# Patient Record
Sex: Female | Born: 1965 | Race: White | Hispanic: No | Marital: Married | Smoking: Never smoker
Health system: Southern US, Community
[De-identification: ages and names within clinical notes are randomized; demographics above are authoritative.]

## PROBLEM LIST (undated history)

## (undated) DIAGNOSIS — K219 Gastro-esophageal reflux disease without esophagitis: Secondary | ICD-10-CM

## (undated) DIAGNOSIS — Z889 Allergy status to unspecified drugs, medicaments and biological substances status: Secondary | ICD-10-CM

## (undated) DIAGNOSIS — E119 Type 2 diabetes mellitus without complications: Secondary | ICD-10-CM

## (undated) HISTORY — DX: Allergy status to unspecified drugs, medicaments and biological substances: Z88.9

## (undated) HISTORY — PX: WISDOM TOOTH EXTRACTION: SHX21

## (undated) HISTORY — PX: COLONOSCOPY: SHX174

## (undated) HISTORY — DX: Type 2 diabetes mellitus without complications: E11.9

---

## 2012-07-27 ENCOUNTER — Ambulatory Visit: Payer: Self-pay | Admitting: Internal Medicine

## 2016-09-06 ENCOUNTER — Other Ambulatory Visit: Payer: Self-pay | Admitting: Obstetrics and Gynecology

## 2016-09-06 ENCOUNTER — Ambulatory Visit (INDEPENDENT_AMBULATORY_CARE_PROVIDER_SITE_OTHER): Payer: BLUE CROSS/BLUE SHIELD | Admitting: Obstetrics and Gynecology

## 2016-09-06 ENCOUNTER — Encounter: Payer: Self-pay | Admitting: Obstetrics and Gynecology

## 2016-09-06 VITALS — BP 134/78 | HR 83 | Ht 64.0 in | Wt 201.7 lb

## 2016-09-06 DIAGNOSIS — Z01419 Encounter for gynecological examination (general) (routine) without abnormal findings: Secondary | ICD-10-CM

## 2016-09-06 DIAGNOSIS — F39 Unspecified mood [affective] disorder: Secondary | ICD-10-CM

## 2016-09-06 DIAGNOSIS — Z23 Encounter for immunization: Secondary | ICD-10-CM

## 2016-09-06 DIAGNOSIS — E669 Obesity, unspecified: Secondary | ICD-10-CM | POA: Diagnosis not present

## 2016-09-06 DIAGNOSIS — R4586 Emotional lability: Secondary | ICD-10-CM

## 2016-09-06 MED ORDER — LEVONORGEST-ETH ESTRAD 91-DAY 0.1-0.02 & 0.01 MG PO TABS: 1.0000 | ORAL_TABLET | Freq: Every day | ORAL | 4 refills | Status: DC

## 2016-09-06 NOTE — Patient Instructions (Signed)
  Place annual gynecologic exam patient instructions here.  Thank you for enrolling in MyChart. Please follow the instructions below to securely access your online medical record. MyChart allows you to send messages to your doctor, view your test results, manage appointments, and more.   How Do I Sign Up? 1. In your Internet browser, go to [employer] and enter [email or web address]. 2. Click on the Sign Up Now link in the Sign In box. You will see the New Member Sign Up page. 3. Enter your MyChart Access Code exactly as it appears below. You will not need to use this code after you've completed the sign-up process. If you do not sign up before the expiration date, you must request a new code.  MyChart Access Code: [email or web address] Expires: 11/05/2016  9:06 AM  4. Enter your Social Security Number ([id number]) and Date of Birth (mm/dd/yyyy) as indicated and click Submit. You will be taken to the next sign-up page. 5. Create a MyChart ID. This will be your MyChart login ID and cannot be changed, so think of one that is secure and easy to remember. 6. Create a MyChart password. You can change your password at any time. 7. Enter your Password Reset Question and Answer. This can be used at a later time if you forget your password.  8. Enter your e-mail address. You will receive e-mail notification when new information is available in MyChart. 9. Click Sign Up. You can now view your medical record.   Additional Information Remember, MyChart is NOT to be used for urgent needs. For medical emergencies, dial 911.

## 2016-09-06 NOTE — Progress Notes (Signed)
Subjective:   Kelsey Baldwin is a 51 y.o. G3P3 Caucasian female here for a routine well-woman exam.  Patient's last menstrual period was 08/27/2016.    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  Past Medical History Past Medical History:  Diagnosis Date  . H/O seasonal allergies     Past Surgical History No past surgical history on file.  Gynecologic History G3P3  Patient's last menstrual period was 08/27/2016. Contraception: coitus interruptus and rhythm method Last Pap: ?Marland Kitchen. Results were: normal Last mammogram: at age 51. Results were: normal   Obstetric History OB History  Gravida Para Term Preterm AB Living  3 3          SAB TAB Ectopic Multiple Live Births               # Outcome Date GA Lbr Len/2nd Weight Sex Delivery Anes PTL Lv  3 Para 2000    F Vag-Spont     2 Para 1994    F Vag-Spont     1 Para 1988    F Vag-Spont         Current Medications No current outpatient prescriptions on file prior to visit.   No current facility-administered medications on file prior to visit.     Review of Systems Patient denies any headaches, blurred vision, shortness of breath, chest pain, abdominal pain, problems with bowel movements, urination, or intercourse.  Objective:  BP 134/78   Pulse 83   Ht 5\' 4"  (1.626 m)   Wt 201 lb 11.2 oz (91.5 kg)   LMP 08/27/2016   BMI 34.62 kg/m  Physical Exam  General:  Well developed, well nourished, no acute distress. She is alert and  oriented x3. Skin:  Warm and dry Neck:  Midline trachea, no thyromegaly or nodules Cardiovascular: Regular rate and rhythm, no murmur heard Lungs:  Effort normal, all lung fields clear to auscultation bilaterally Breasts:  No dominant palpable mass, retraction, or nipple discharge Abdomen:  Soft, non tender, no hepatosplenomegaly or masses Pelvic:  External genitalia is normal in appearance.  The vagina is normal in appearance. The cervix is bulbous, no CMT.  Thin prep pap is done with HR HPV cotesting. Uterus is felt to be normal size, shape, and contour.  No adnexal masses or tenderness noted. Extremities:  No swelling or varicosities noted Psych:  She has a normal mood and affect  Assessment:   Healthy well-woman exam BC counseling Needs Tdap Obesity Mood swings   Plan:  Declined flu vaccine Tdap given New start LoSeasonique F/U 1 year for AE, or sooner if needed Mammogram ordered or sooner if problems Colonoscopy ordered   Kalob Bergen Suzan NailerN Chalmers Iddings, CNM

## 2016-09-07 LAB — COMPREHENSIVE METABOLIC PANEL
ALK PHOS: 87 IU/L (ref 39–117)
ALT: 14 IU/L (ref 0–32)
AST: 16 IU/L (ref 0–40)
Albumin/Globulin Ratio: 1.3 (ref 1.2–2.2)
Albumin: 3.8 g/dL (ref 3.5–5.5)
BILIRUBIN TOTAL: 0.3 mg/dL (ref 0.0–1.2)
BUN/Creatinine Ratio: 18 (ref 9–23)
BUN: 12 mg/dL (ref 6–24)
CHLORIDE: 101 mmol/L (ref 96–106)
CO2: 24 mmol/L (ref 18–29)
CREATININE: 0.68 mg/dL (ref 0.57–1.00)
Calcium: 8.7 mg/dL (ref 8.7–10.2)
GFR calc Af Amer: 118 mL/min/{1.73_m2} (ref >59)
GFR calc non Af Amer: 102 mL/min/{1.73_m2} (ref >59)
GLUCOSE: 101 mg/dL — AB (ref 65–99)
Globulin, Total: 3 g/dL (ref 1.5–4.5)
Potassium: 4 mmol/L (ref 3.5–5.2)
Sodium: 139 mmol/L (ref 134–144)
Total Protein: 6.8 g/dL (ref 6.0–8.5)

## 2016-09-07 LAB — CBC
HEMATOCRIT: 38.1 % (ref 34.0–46.6)
HEMOGLOBIN: 12.9 g/dL (ref 11.1–15.9)
MCH: 28 pg (ref 26.6–33.0)
MCHC: 33.9 g/dL (ref 31.5–35.7)
MCV: 83 fL (ref 79–97)
Platelets: 292 10*3/uL (ref 150–379)
RBC: 4.6 x10E6/uL (ref 3.77–5.28)
RDW: 14.2 % (ref 12.3–15.4)
WBC: 7.3 10*3/uL (ref 3.4–10.8)

## 2016-09-07 LAB — HEMOGLOBIN A1C
ESTIMATED AVERAGE GLUCOSE: 123 mg/dL
Hgb A1c MFr Bld: 5.9 % — ABNORMAL HIGH (ref 4.8–5.6)

## 2016-09-07 LAB — LIPID PANEL
CHOL/HDL RATIO: 4.1 ratio (ref 0.0–4.4)
Cholesterol, Total: 157 mg/dL (ref 100–199)
HDL: 38 mg/dL — ABNORMAL LOW (ref >39)
LDL Calculated: 68 mg/dL (ref 0–99)
Triglycerides: 254 mg/dL — ABNORMAL HIGH (ref 0–149)
VLDL CHOLESTEROL CAL: 51 mg/dL — AB (ref 5–40)

## 2016-09-07 LAB — VITAMIN D 25 HYDROXY (VIT D DEFICIENCY, FRACTURES): VIT D 25 HYDROXY: 16.5 ng/mL — AB (ref 30.0–100.0)

## 2016-09-07 LAB — TSH: TSH: 1.09 u[IU]/mL (ref 0.450–4.500)

## 2016-09-09 LAB — CYTOLOGY - PAP

## 2016-09-11 ENCOUNTER — Other Ambulatory Visit: Payer: Self-pay

## 2016-09-11 ENCOUNTER — Telehealth: Payer: Self-pay

## 2016-09-11 NOTE — Telephone Encounter (Signed)
Gastroenterology Pre-Procedure Review  Request Date:  Requesting Physician: Dr.   PATIENT REVIEW QUESTIONS: The patient responded to the following health history questions as indicated:    1. Are you having any GI issues? no 2. Do you have a personal history of Polyps? no 3. Do you have a family history of Colon Cancer or Polyps? no 4. Diabetes Mellitus? no 5. Joint replacements in the past 12 months?no 6. Major health problems in the past 3 months?no 7. Any artificial heart valves, MVP, or defibrillator?no    MEDICATIONS & ALLERGIES:    Patient reports the following regarding taking any anticoagulation/antiplatelet therapy:   Plavix, Coumadin, Eliquis, Xarelto, Lovenox, Pradaxa, Brilinta, or Effient? no Aspirin? no  Patient confirms/reports the following medications:  Current Outpatient Prescriptions  Medication Sig Dispense Refill  . fluticasone (FLONASE) 50 MCG/ACT nasal spray Place into both nostrils daily.    . Levonorgestrel-Ethinyl Estradiol (AMETHIA,CAMRESE) 0.1-0.02 & 0.01 MG tablet Take 1 tablet by mouth daily. 1 Package 4   No current facility-administered medications for this visit.     Patient confirms/reports the following allergies:  No Known Allergies  No orders of the defined types were placed in this encounter.   AUTHORIZATION INFORMATION Primary Insurance: 1D#: Group #:  Secondary Insurance: 1D#: Group #:  SCHEDULE INFORMATION: Date: 10/10/16 Time: Location: [facility]

## 2016-09-18 ENCOUNTER — Telehealth: Payer: Self-pay | Admitting: *Deleted

## 2016-09-18 ENCOUNTER — Other Ambulatory Visit: Payer: Self-pay | Admitting: Obstetrics and Gynecology

## 2016-09-18 DIAGNOSIS — R7303 Prediabetes: Secondary | ICD-10-CM

## 2016-09-18 DIAGNOSIS — E559 Vitamin D deficiency, unspecified: Secondary | ICD-10-CM | POA: Insufficient documentation

## 2016-09-18 MED ORDER — VITAMIN D (ERGOCALCIFEROL) 1.25 MG (50000 UNIT) PO CAPS: 50000.0000 [IU] | ORAL_CAPSULE | ORAL | 1 refills | Status: DC

## 2016-09-18 NOTE — Telephone Encounter (Signed)
Mailed info to pt 

## 2016-09-18 NOTE — Telephone Encounter (Signed)
-----   Message from Purcell NailsMelody N Shambley, [place] sent at 09/18/2016  3:02 PM EST ----- Please mail info on vit D and Pre-diabetes

## 2016-09-30 ENCOUNTER — Encounter: Payer: Self-pay | Admitting: Obstetrics and Gynecology

## 2016-12-04 ENCOUNTER — Other Ambulatory Visit: Payer: Self-pay

## 2016-12-16 ENCOUNTER — Encounter: Payer: Self-pay | Admitting: Obstetrics and Gynecology

## 2016-12-17 NOTE — Discharge Instructions (Signed)

## 2016-12-19 ENCOUNTER — Ambulatory Visit: Payer: BLUE CROSS/BLUE SHIELD | Admitting: Anesthesiology

## 2016-12-19 ENCOUNTER — Ambulatory Visit
Admission: RE | Admit: 2016-12-19 | Discharge: 2016-12-19 | Disposition: A | Discharge status: Home / Self Care | Payer: BLUE CROSS/BLUE SHIELD | Source: Ambulatory Visit | Attending: Gastroenterology | Admitting: Gastroenterology

## 2016-12-19 ENCOUNTER — Encounter
Admission: RE | Disposition: A | Discharge status: Home / Self Care | Payer: Self-pay | Source: Ambulatory Visit | Attending: Gastroenterology

## 2016-12-19 DIAGNOSIS — Z79899 Other long term (current) drug therapy: Secondary | ICD-10-CM | POA: Insufficient documentation

## 2016-12-19 DIAGNOSIS — Z1211 Encounter for screening for malignant neoplasm of colon: Secondary | ICD-10-CM | POA: Diagnosis not present

## 2016-12-19 DIAGNOSIS — K219 Gastro-esophageal reflux disease without esophagitis: Secondary | ICD-10-CM | POA: Insufficient documentation

## 2016-12-19 HISTORY — DX: Gastro-esophageal reflux disease without esophagitis: K21.9

## 2016-12-19 HISTORY — PX: COLONOSCOPY WITH PROPOFOL: SHX5780

## 2016-12-19 SURGERY — COLONOSCOPY WITH PROPOFOL
Anesthesia: Monitor Anesthesia Care | Wound class: Contaminated

## 2016-12-19 MED ORDER — LACTATED RINGERS IV SOLN
INTRAVENOUS | Status: DC
  Administered 2016-12-19 (×2): via INTRAVENOUS

## 2016-12-19 MED ORDER — OXYCODONE HCL 5 MG/5ML PO SOLN: 5.0000 mg | Freq: Once | ORAL | Status: DC | PRN

## 2016-12-19 MED ORDER — OXYCODONE HCL 5 MG PO TABS: 5.0000 mg | ORAL_TABLET | Freq: Once | ORAL | Status: DC | PRN

## 2016-12-19 MED ORDER — LIDOCAINE HCL (CARDIAC) 20 MG/ML IV SOLN
INTRAVENOUS | Status: DC | PRN
  Administered 2016-12-19: 30 mg via INTRAVENOUS

## 2016-12-19 MED ORDER — PROPOFOL 10 MG/ML IV BOLUS
INTRAVENOUS | Status: DC | PRN
  Administered 2016-12-19: 20 mg via INTRAVENOUS
  Administered 2016-12-19 (×2): 60 mg via INTRAVENOUS
  Administered 2016-12-19 (×3): 20 mg via INTRAVENOUS
  Administered 2016-12-19: 60 mg via INTRAVENOUS

## 2016-12-19 MED ORDER — STERILE WATER FOR IRRIGATION IR SOLN
Status: DC | PRN
  Administered 2016-12-19: 08:00:00

## 2016-12-19 SURGICAL SUPPLY — 23 items

## 2016-12-19 NOTE — Transfer of Care (Signed)
Immediate Anesthesia Transfer of Care Note  Patient: Kelsey Baldwin  Procedure(s) Performed: Procedure(s): COLONOSCOPY WITH PROPOFOL (N/A)  Patient Location: PACU  Anesthesia Type: MAC  Level of Consciousness: awake, alert  and patient cooperative  Airway and Oxygen Therapy: Patient Spontanous Breathing and Patient connected to supplemental oxygen  Post-op Assessment: Post-op Vital signs reviewed, Patient's Cardiovascular Status Stable, Respiratory Function Stable, Patent Airway and No signs of Nausea or vomiting  Post-op Vital Signs: Reviewed and stable  Complications: No apparent anesthesia complications

## 2016-12-19 NOTE — Anesthesia Postprocedure Evaluation (Signed)
Anesthesia Post Note  Patient: Kelsey Baldwin  Procedure(s) Performed: Procedure(s) (LRB): COLONOSCOPY WITH PROPOFOL (N/A)  Patient location during evaluation: PACU Anesthesia Type: MAC Level of consciousness: awake and alert Pain management: pain level controlled Vital Signs Assessment: post-procedure vital signs reviewed and stable Respiratory status: spontaneous breathing Cardiovascular status: blood pressure returned to baseline Postop Assessment: no headache Anesthetic complications: no    Jaci Standard, III,  Ashley Montminy D

## 2016-12-19 NOTE — H&P (Signed)
   Kelsey Miniumarren Phillis Thackeray, MD [facility] [address] [place], [place] [address] Phone: [phone] Fax : [phone]  Primary Care Physician:  Patient, No Pcp Per Primary Gastroenterologist:  Dr. Servando SnareWohl  Pre-Procedure History & Physical: HPI:  Kelsey Baldwin is a 51 y.o. female is here for a screening colonoscopy.   Past Medical History:  Diagnosis Date  . GERD (gastroesophageal reflux disease)   . H/O seasonal allergies     Past Surgical History:  Procedure Laterality Date  . COLONOSCOPY    . WISDOM TOOTH EXTRACTION      Prior to Admission medications   Medication Sig Start Date End Date Taking? Authorizing Provider  Ascorbic Acid (VITAMIN C) 1000 MG tablet Take 1,000 mg by mouth daily.   Yes [provider]  fluticasone (FLONASE) 50 MCG/ACT nasal spray Place into both nostrils daily.   Yes [provider]  Levonorgestrel-Ethinyl Estradiol (AMETHIA,CAMRESE) 0.1-0.02 & 0.01 MG tablet Take 1 tablet by mouth daily. 09/06/16  Yes Shambley, Melody N, CNM  Multiple Vitamin (MULTIVITAMIN) tablet Take 1 tablet by mouth daily.   Yes [provider]  omeprazole (PRILOSEC) 20 MG capsule Take 20 mg by mouth daily.   Yes [provider]  Vitamin D, Ergocalciferol, (DRISDOL) 50000 units CAPS capsule Take 1 capsule (50,000 Units total) by mouth 2 (two) times a week. 09/19/16  Yes Shambley, Melody N, CNM    Allergies as of 09/11/2016  . (No Known Allergies)    Family History  Problem Relation Age of Onset  . Diabetes Mother   . Dementia Mother   . Cancer Father        lung     Social History   Social History  . Marital status: Married    Spouse name: N/A  . Number of children: N/A  . Years of education: N/A   Occupational History  . Not on file.   Social History Main Topics  . Smoking status: Never Smoker  . Smokeless tobacco: Never Used  . Alcohol use Yes  . Drug use: No  . Sexual activity: Yes    Birth control/ protection: None   Other  Topics Concern  . Not on file   Social History Narrative  . No narrative on file    Review of Systems: See HPI, otherwise negative ROS  Physical Exam: BP 121/80   Baldwin 70   Temp 97.7 F (36.5 C) (Temporal)   Ht 5\' 4"  (1.626 m)   Wt 179 lb (81.2 kg)   SpO2 99%   BMI 30.73 kg/m  General:   Alert,  pleasant and cooperative in NAD Head:  Normocephalic and atraumatic. Neck:  Supple; no masses or thyromegaly. Lungs:  Clear throughout to auscultation.    Heart:  Regular rate and rhythm. Abdomen:  Soft, nontender and nondistended. Normal bowel sounds, without guarding, and without rebound.   Neurologic:  Alert and  oriented x4;  grossly normal neurologically.  Impression/Plan: Kelsey Baldwin is now here to undergo a screening colonoscopy.  Risks, benefits, and alternatives regarding colonoscopy have been reviewed with the patient.  Questions have been answered.  All parties agreeable.

## 2016-12-19 NOTE — Anesthesia Preprocedure Evaluation (Signed)
Anesthesia Evaluation  Patient identified by MRN, date of birth, ID band Patient awake    Reviewed: Allergy & Precautions, NPO status , Patient's Chart, lab work & pertinent test results  Airway Mallampati: II  TM Distance: >3 FB Neck ROM: full    Dental no notable dental hx.    Pulmonary neg pulmonary ROS,    Pulmonary exam normal        Cardiovascular negative cardio ROS Normal cardiovascular exam     Neuro/Psych    GI/Hepatic Neg liver ROS, Medicated,  Endo/Other  negative endocrine ROS  Renal/GU negative Renal ROS     Musculoskeletal   Abdominal   Peds  Hematology negative hematology ROS (+)   Anesthesia Other Findings   Reproductive/Obstetrics                            Anesthesia Physical Anesthesia Plan  ASA: II  Anesthesia Plan: MAC   Post-op Pain Management:    Induction:   Airway Management Planned:   Additional Equipment:   Intra-op Plan:   Post-operative Plan:   Informed Consent: I have reviewed the patients History and Physical, chart, labs and discussed the procedure including the risks, benefits and alternatives for the proposed anesthesia with the patient or authorized representative who has indicated his/her understanding and acceptance.     Plan Discussed with:   Anesthesia Plan Comments:         Anesthesia Quick Evaluation

## 2016-12-19 NOTE — Anesthesia Procedure Notes (Signed)
Procedure Name: MAC Performed by: Oyuki Hogan Pre-anesthesia Checklist: Patient identified, Emergency Drugs available, Suction available, Patient being monitored and Timeout performed Patient Re-evaluated:Patient Re-evaluated prior to inductionOxygen Delivery Method: Nasal cannula       

## 2016-12-19 NOTE — Op Note (Signed)
[facility] Gastroenterology Patient Name: Kelsey BridgemanWendy Miyasato Procedure Date: 12/19/2016 7:38 AM MRN: [id number] Account #: [id number] Date of Birth: 12/19/1965 Admit Type: Outpatient Age: [age] [facility] Gender: Female Note Status: Finalized Procedure:            Colonoscopy Indications:          Screening for colorectal malignant neoplasm Providers:            Midge Miniumarren Donavan Kerlin MD, MD Referring MD:         Melodye PedMelody N. Aura CampsShambley CNM, CNM (Referring MD) Medicines:            Propofol per Anesthesia Complications:        No immediate complications. Procedure:            Pre-Anesthesia Assessment:                       - Prior to the procedure, a History and Physical was                        performed, and patient medications and allergies were                        reviewed. The patient's tolerance of previous                        anesthesia was also reviewed. The risks and benefits of                        the procedure and the sedation options and risks were                        discussed with the patient. All questions were                        answered, and informed consent was obtained. Prior                        Anticoagulants: The patient has taken no previous                        anticoagulant or antiplatelet agents. ASA Grade                        Assessment: II - A patient with mild systemic disease.                        After reviewing the risks and benefits, the patient was                        deemed in satisfactory condition to undergo the                        procedure.                       After obtaining informed consent, the colonoscope was                        passed under direct vision. Throughout the procedure,  the patient's blood pressure, pulse, and oxygen                        saturations were monitored continuously. The Olympus                        190 Colonoscope [id number]) was introduced  through the                        anus and advanced to the the cecum, identified by                        appendiceal orifice and ileocecal valve. The                        colonoscopy was performed without difficulty. The                        patient tolerated the procedure well. The quality of                        the bowel preparation was excellent. Findings:      The perianal and digital rectal examinations were normal.      The colon (entire examined portion) appeared normal. Impression:           - The entire examined colon is normal.                       - No specimens collected. Recommendation:       - Discharge patient to home.                       - Resume previous diet.                       - Continue present medications.                       - Repeat colonoscopy in 10 years for screening unless                        any change in family history or lower GI problems. Procedure Code(s):    --- Professional ---                       [phone], Colonoscopy, flexible; diagnostic, including                        collection of specimen(s) by brushing or washing, when                        performed (separate procedure) Diagnosis Code(s):    --- Professional ---                       Z12.11, Encounter for screening for malignant neoplasm                        of colon CPT copyright 2016 American Medical Association. All rights reserved. The codes documented in this report are preliminary and upon coder review may  be revised to meet current compliance requirements. Midge Minium MD,  MD 12/19/2016 8:30:46 AM This report has been signed electronically. Number of Addenda: 0 Note Initiated On: 12/19/2016 7:38 AM Scope Withdrawal Time: 0 hours 6 minutes 6 seconds  Total Procedure Duration: 0 hours 13 minutes 26 seconds       [facility]

## 2016-12-20 ENCOUNTER — Encounter: Payer: Self-pay | Admitting: Gastroenterology

## 2017-03-16 ENCOUNTER — Other Ambulatory Visit: Payer: Self-pay | Admitting: Obstetrics and Gynecology

## 2017-08-06 ENCOUNTER — Encounter: Payer: Self-pay | Admitting: Obstetrics and Gynecology

## 2017-08-18 ENCOUNTER — Encounter: Payer: Self-pay | Admitting: Obstetrics and Gynecology

## 2017-08-18 ENCOUNTER — Other Ambulatory Visit: Payer: Self-pay | Admitting: *Deleted

## 2017-08-18 MED ORDER — LEVONORGEST-ETH ESTRAD 91-DAY 0.1-0.02 & 0.01 MG PO TABS: 1.0000 | ORAL_TABLET | Freq: Every day | ORAL | 4 refills | Status: DC

## 2017-09-11 ENCOUNTER — Encounter: Payer: BLUE CROSS/BLUE SHIELD | Admitting: Obstetrics and Gynecology

## 2017-12-04 ENCOUNTER — Ambulatory Visit (INDEPENDENT_AMBULATORY_CARE_PROVIDER_SITE_OTHER): Payer: BLUE CROSS/BLUE SHIELD | Admitting: Obstetrics and Gynecology

## 2017-12-04 ENCOUNTER — Encounter: Payer: Self-pay | Admitting: Obstetrics and Gynecology

## 2017-12-04 VITALS — BP 143/93 | HR 100 | Ht 64.0 in | Wt 196.7 lb

## 2017-12-04 DIAGNOSIS — E559 Vitamin D deficiency, unspecified: Secondary | ICD-10-CM | POA: Diagnosis not present

## 2017-12-04 DIAGNOSIS — Z01419 Encounter for gynecological examination (general) (routine) without abnormal findings: Secondary | ICD-10-CM | POA: Diagnosis not present

## 2017-12-04 DIAGNOSIS — R7303 Prediabetes: Secondary | ICD-10-CM | POA: Diagnosis not present

## 2017-12-04 MED ORDER — OMEPRAZOLE 20 MG PO CPDR: 20.0000 mg | DELAYED_RELEASE_CAPSULE | Freq: Every day | ORAL | 4 refills | Status: DC

## 2017-12-04 MED ORDER — VITAMIN D (ERGOCALCIFEROL) 1.25 MG (50000 UNIT) PO CAPS: 50000.0000 [IU] | ORAL_CAPSULE | ORAL | 2 refills | Status: DC

## 2017-12-04 NOTE — Progress Notes (Signed)
Subjective:   Kelsey Baldwin is a 52 y.o. G3P3 Caucasian female here for a routine well-woman exam.  No LMP recorded. (Menstrual status: Oral contraceptives).    Current complaints: none PCP: Audelia Acton   doesn't desire labs  Social History: Sexual: heterosexual Marital Status: married Living situation: with family Occupation: homemaker Tobacco/alcohol: no tobacco use Illicit drugs: no history of illicit drug use  The following portions of the patient's history were reviewed and updated as appropriate: allergies, current medications, past family history, past medical history, past social history, past surgical history and problem list.  Past Medical History Past Medical History:  Diagnosis Date  . GERD (gastroesophageal reflux disease)   . H/O seasonal allergies     Past Surgical History Past Surgical History:  Procedure Laterality Date  . COLONOSCOPY    . COLONOSCOPY WITH PROPOFOL N/A 12/19/2016   Procedure: COLONOSCOPY WITH PROPOFOL;  Surgeon: Midge Minium, MD;  [facility];  Service: Endoscopy;  Laterality: N/A;  . WISDOM TOOTH EXTRACTION      Gynecologic History G3P3  No LMP recorded. (Menstrual status: Oral contraceptives). Contraception: coitus interruptus Last Pap: 09/2016. Results were: normal   Obstetric History OB History  Gravida Para Term Preterm AB Living  3 3          SAB TAB Ectopic Multiple Live Births               # Outcome Date GA Lbr Len/2nd Weight Sex Delivery Anes PTL Lv  3 Para 2000    F Vag-Spont     2 Para 1994    F Vag-Spont     1 Para 1988    F Vag-Spont       Current Medications Current Outpatient Medications on File Prior to Visit  Medication Sig Dispense Refill  . Ascorbic Acid (VITAMIN C) 1000 MG tablet Take 1,000 mg by mouth daily.    . fluticasone (FLONASE) 50 MCG/ACT nasal spray Place into both nostrils daily.    . Levonorgestrel-Ethinyl Estradiol (AMETHIA,CAMRESE) 0.1-0.02 & 0.01 MG tablet Take 1 tablet by  mouth daily. 3 Package 4  . Multiple Vitamin (MULTIVITAMIN) tablet Take 1 tablet by mouth daily.    Marland Kitchen omeprazole (PRILOSEC) 20 MG capsule Take 20 mg by mouth daily.    . Thiamine HCl (VITAMIN B-1) 250 MG tablet Take 250 mg by mouth daily.    . Vitamin D, Ergocalciferol, (DRISDOL) 50000 units CAPS capsule TAKE 1 CAPSULE (50,000 UNITS TOTAL) BY MOUTH 2 (TWO) TIMES A WEEK. 30 capsule 2   No current facility-administered medications on file prior to visit.     Review of Systems Patient denies any headaches, blurred vision, shortness of breath, chest pain, abdominal pain, problems with bowel movements, urination, or intercourse.  Objective:  BP (!) 143/93   Pulse 100   Ht  (1.626 m)   Wt 196 lb 11.2 oz (89.2 kg)   BMI 33.76 kg/m  Physical Exam  General:  Well developed, well nourished, no acute distress. She is alert and oriented x3. Skin:  Warm and dry Neck:  Midline trachea, no thyromegaly or nodules Cardiovascular: Regular rate and rhythm, no murmur heard Lungs:  Effort normal, all lung fields clear to auscultation bilaterally Breasts:  No dominant palpable mass, retraction, or nipple discharge Abdomen:  Soft, non tender, no hepatosplenomegaly or masses Pelvic:  External genitalia is normal in appearance.  The vagina is normal in appearance. The cervix is bulbous, no CMT.  Thin prep pap is not done.  Uterus is felt to be normal size, shape, and contour.  No adnexal masses or tenderness noted. Extremities:  No swelling or varicosities noted Psych:  She has a normal mood and affect  Assessment:   Healthy well-woman exam Vit D def obesity Pre-diabetes  Plan:  Labs obtained per PCP F/U 1 year for AE, or sooner if needed Mammogram ordered   Osha Rane Suzan Nailer, CNM

## 2017-12-11 ENCOUNTER — Ambulatory Visit
Admission: RE | Admit: 2017-12-11 | Discharge: 2017-12-11 | Disposition: A | Discharge status: Home / Self Care | Payer: BLUE CROSS/BLUE SHIELD | Source: Ambulatory Visit | Attending: Obstetrics and Gynecology | Admitting: Obstetrics and Gynecology

## 2017-12-11 DIAGNOSIS — Z1231 Encounter for screening mammogram for malignant neoplasm of breast: Secondary | ICD-10-CM | POA: Diagnosis not present

## 2017-12-11 DIAGNOSIS — Z01419 Encounter for gynecological examination (general) (routine) without abnormal findings: Secondary | ICD-10-CM

## 2018-01-07 ENCOUNTER — Encounter: Payer: Self-pay | Admitting: Obstetrics and Gynecology

## 2018-08-28 ENCOUNTER — Other Ambulatory Visit: Payer: Self-pay | Admitting: Obstetrics and Gynecology

## 2018-12-08 ENCOUNTER — Encounter: Payer: BLUE CROSS/BLUE SHIELD | Admitting: Obstetrics and Gynecology

## 2018-12-14 ENCOUNTER — Other Ambulatory Visit: Payer: Self-pay | Admitting: Obstetrics and Gynecology

## 2019-02-01 ENCOUNTER — Telehealth: Payer: Self-pay

## 2019-02-01 NOTE — Telephone Encounter (Signed)
Coronavirus (COVID-19) Are you at risk?  Are you at risk for the Coronavirus (COVID-19)?  To be considered HIGH RISK for Coronavirus (COVID-19), you have to meet the following criteria:  . Traveled to [place], [place], [place], [place] or [place]; or in the [place] to [place], [place], [place], or [place]; and have fever, cough, and shortness of breath within the last 2 weeks of travel OR . Been in close contact with a person diagnosed with COVID-19 within the last 2 weeks and have fever, cough, and shortness of breath . IF YOU DO NOT MEET THESE CRITERIA, YOU ARE CONSIDERED LOW RISK FOR COVID-19.  What to do if you are HIGH RISK for COVID-19?  . If you are having a medical emergency, call 911. . Seek medical care right away. Before you go to a doctor's office, urgent care or emergency department, call ahead and tell them about your recent travel, contact with someone diagnosed with COVID-19, and your symptoms. You should receive instructions from your physician's office regarding next steps of care.  . When you arrive at healthcare provider, tell the healthcare staff immediately you have returned from visiting [place], [place], [place], [place] or [place]; or traveled in the [place] to [place], [place], [place], or [place]; in the last two weeks or you have been in close contact with a person diagnosed with COVID-19 in the last 2 weeks.   . Tell the health care staff about your symptoms: fever, cough and shortness of breath. . After you have been seen by a medical provider, you will be either: o Tested for (COVID-19) and discharged home on quarantine except to seek medical care if symptoms worsen, and asked to  - Stay home and avoid contact with others until you get your results (4-5 days)  - Avoid travel on public transportation if possible (such as bus, train, or airplane) or o Sent to the Emergency Department by EMS for evaluation, COVID-19 testing, and possible  admission depending on your condition and test results.  What to do if you are LOW RISK for COVID-19?  Reduce your risk of any infection by using the same precautions used for avoiding the common cold or flu:  . Wash your hands often with soap and warm water for at least 20 seconds.  If soap and water are not readily available, use an alcohol-based hand sanitizer with at least 60% alcohol.  . If coughing or sneezing, cover your mouth and nose by coughing or sneezing into the elbow areas of your shirt or coat, into a tissue or into your sleeve (not your hands). . Avoid shaking hands with others and consider head nods or verbal greetings only. . Avoid touching your eyes, nose, or mouth with unwashed hands.  . Avoid close contact with people who are sick. . Avoid places or events with large numbers of people in one location, like concerts or sporting events. . Carefully consider travel plans you have or are making. . If you are planning any travel outside or inside the [place], visit the CDC's Travelers' Health webpage for the latest health notices. . If you have some symptoms but not all symptoms, continue to monitor at home and seek medical attention if your symptoms worsen. . If you are having a medical emergency, call 911.   ADDITIONAL HEALTHCARE OPTIONS FOR PATIENTS  [facility] / e-Visit: [email or web address]         [facility]: [phone]  [facility]: [phone]                   [facility]: [phone]   Pre-screen negative, DM.   

## 2019-02-02 ENCOUNTER — Ambulatory Visit (INDEPENDENT_AMBULATORY_CARE_PROVIDER_SITE_OTHER): Payer: BC Managed Care – PPO | Admitting: Obstetrics and Gynecology

## 2019-02-02 ENCOUNTER — Encounter: Payer: Self-pay | Admitting: Obstetrics and Gynecology

## 2019-02-02 ENCOUNTER — Other Ambulatory Visit: Payer: Self-pay

## 2019-02-02 VITALS — BP 112/77 | HR 90 | Ht 64.0 in | Wt 200.8 lb

## 2019-02-02 DIAGNOSIS — Z6837 Body mass index (BMI) 37.0-37.9, adult: Secondary | ICD-10-CM | POA: Diagnosis not present

## 2019-02-02 DIAGNOSIS — Z01419 Encounter for gynecological examination (general) (routine) without abnormal findings: Secondary | ICD-10-CM | POA: Diagnosis not present

## 2019-02-02 DIAGNOSIS — R7303 Prediabetes: Secondary | ICD-10-CM | POA: Diagnosis not present

## 2019-02-02 DIAGNOSIS — N926 Irregular menstruation, unspecified: Secondary | ICD-10-CM | POA: Diagnosis not present

## 2019-02-02 NOTE — Progress Notes (Signed)
Subjective:   Kelsey Baldwin is a 53 y.o. G3P3 Caucasian female here for a routine well-woman exam.  Patient's last menstrual period was 01/11/2019.    Current complaints: stopped OCPs last year and menses are occurring monthly or every other month, sometimes heavy.  PCP: Audelia Actonheis       doesn't desire labs  Social History: Sexual: heterosexual Marital Status: married Living situation: with spouse Occupation: homemaker Tobacco/alcohol: no tobacco use Illicit drugs: no history of illicit drug use  The following portions of the patient's history were reviewed and updated as appropriate: allergies, current medications, past family history, past medical history, past social history, past surgical history and problem list.  Past Medical History Past Medical History:  Diagnosis Date  . GERD (gastroesophageal reflux disease)   . H/O seasonal allergies     Past Surgical History Past Surgical History:  Procedure Laterality Date  . COLONOSCOPY    . COLONOSCOPY WITH PROPOFOL N/A 12/19/2016   Procedure: COLONOSCOPY WITH PROPOFOL;  Surgeon: Midge MiniumWohl, Darren, MD;  [facility];  Service: Endoscopy;  Laterality: N/A;  . WISDOM TOOTH EXTRACTION      Gynecologic History G3P3  Patient's last menstrual period was 01/11/2019. Contraception: coitus interruptus Last Pap: 2018. Results were: normal Last mammogram: 2019. Results were: normal   Obstetric History OB History  Gravida Para Term Preterm AB Living  3 3          SAB TAB Ectopic Multiple Live Births               # Outcome Date GA Lbr Len/2nd Weight Sex Delivery Anes PTL Lv  3 Para 2000    F Vag-Spont     2 Para 1994    F Vag-Spont     1 Para 1988    F Vag-Spont       Current Medications Current Outpatient Medications on File Prior to Visit  Medication Sig Dispense Refill  . Ascorbic Acid (VITAMIN C) 1000 MG tablet Take 1,000 mg by mouth daily.    . fluticasone (FLONASE) 50 MCG/ACT nasal spray Place into both  nostrils daily.    Marland Kitchen. levocetirizine (XYZAL) 5 MG tablet Take 5 mg by mouth every evening.    . montelukast (SINGULAIR) 10 MG tablet Take 10 mg by mouth at bedtime.    . Multiple Vitamin (MULTIVITAMIN) tablet Take 1 tablet by mouth daily.    Marland Kitchen. omeprazole (PRILOSEC) 20 MG capsule Take 1 capsule (20 mg total) by mouth daily. 90 capsule 4  . vitamin B-12 (CYANOCOBALAMIN) 1000 MCG tablet Take 1,000 mcg by mouth 3 (three) times daily.    . Vitamin D, Ergocalciferol, (DRISDOL) 1.25 MG (50000 UT) CAPS capsule TAKE 1 CAPSULE (50,000 UNITS TOTAL) BY MOUTH 2 (TWO) TIMES A WEEK. 24 capsule 3  . Levonorgestrel-Ethinyl Estradiol (AMETHIA,CAMRESE) 0.1-0.02 & 0.01 MG tablet TAKE 1 TABLET BY MOUTH EVERY DAY (Patient not taking: Reported on 02/02/2019) 91 tablet 4   No current facility-administered medications on file prior to visit.     Review of Systems Patient denies any headaches, blurred vision, shortness of breath, chest pain, abdominal pain, problems with bowel movements, urination, or intercourse.  Objective:  BP 112/77   Pulse 90   Ht 5\' 4"  (1.626 m)   Wt 200 lb 12.8 oz (91.1 kg)   LMP 01/11/2019   BMI 34.47 kg/m  Physical Exam  General:  Well developed, well nourished, no acute distress. She is alert and oriented x3. Skin:  Warm and dry Neck:  Midline trachea, no thyromegaly or nodules Cardiovascular: Regular rate and rhythm, no murmur heard Lungs:  Effort normal, all lung fields clear to auscultation bilaterally Breasts:  No dominant palpable mass, retraction, or nipple discharge Abdomen:  Soft, non tender, no hepatosplenomegaly or masses Pelvic:  External genitalia is normal in appearance.  The vagina is normal in appearance. The cervix is bulbous, no CMT.  Thin prep pap is not done . Uterus is felt to be normal size, shape, and contour.  No adnexal masses or tenderness noted. Extremities:  No swelling or varicosities noted Psych:  She has a normal mood and affect  Assessment:   Healthy  well-woman exam BMI 37 Pre-diabetes   Plan:  Discussed Lysteda use for heavy menses F/U 1 year for AE, or sooner if needed Mammogram ordered  Melody Rockney Ghee, CNM

## 2019-02-03 LAB — HEMOGLOBIN A1C
Est. average glucose Bld gHb Est-mCnc: 126 mg/dL
Hgb A1c MFr Bld: 6 % — ABNORMAL HIGH (ref 4.8–5.6)

## 2019-03-05 ENCOUNTER — Other Ambulatory Visit: Payer: Self-pay | Admitting: Obstetrics and Gynecology

## 2019-05-14 ENCOUNTER — Other Ambulatory Visit: Payer: Self-pay

## 2019-05-14 ENCOUNTER — Ambulatory Visit: Payer: BC Managed Care – PPO | Admitting: Obstetrics and Gynecology

## 2019-05-14 ENCOUNTER — Encounter: Payer: Self-pay | Admitting: Obstetrics and Gynecology

## 2019-05-14 VITALS — BP 105/78 | HR 95 | Ht 64.0 in | Wt 202.1 lb

## 2019-05-14 DIAGNOSIS — F322 Major depressive disorder, single episode, severe without psychotic features: Secondary | ICD-10-CM | POA: Diagnosis not present

## 2019-05-14 MED ORDER — FLUOXETINE HCL 10 MG PO CAPS: 10.0000 mg | ORAL_CAPSULE | Freq: Every day | ORAL | 3 refills | Status: DC

## 2019-05-14 NOTE — Patient Instructions (Signed)

## 2019-05-14 NOTE — Progress Notes (Signed)
  Subjective:     Patient ID: Kelsey Baldwin, female   DOB: Mar 15, 1966, 53 y.o.   MRN: [id number]  HPI  GAD 7 : Generalized Anxiety Score 05/14/2019  Nervous, Anxious, on Edge 3  Control/stop worrying 2  Worry too much - different things 2  Trouble relaxing 1  Restless 0  Easily annoyed or irritable 3  Afraid - awful might happen 2  Total GAD 7 Score 13  Anxiety Difficulty Very difficult   Depression screen PHQ 2/9 05/14/2019  Decreased Interest 3  Down, Depressed, Hopeless 3  PHQ - 2 Score 6  Altered sleeping 3  Tired, decreased energy 3  Change in appetite 3  Feeling bad or failure about yourself  3  Trouble concentrating 3  Moving slowly or fidgety/restless 0  Suicidal thoughts 0  PHQ-9 Score 21  Difficult doing work/chores Somewhat difficult   Reports feeling this way for years, but has been worse over last year. Feels like she overreacts a lot and is easily agitated. And feels sluggish all the other times. Reports increased urges to drink to take the edge off, she doesn't do it but thinks about it.  Is currently in counseling. Feels like it helps. Does a televisit every 2 weeks at this time.  Has never been on medication for anxiety or depression. States she has always covered it up and pushed through it.   Review of Systems     Objective:   Physical Exam Vitals with BMI 05/14/2019 02/02/2019 12/04/2017  Height 5\' 4"  5\' 4"  5\' 4"   Weight 202 lbs 2 oz 200 lbs 13 oz 196 lbs 11 oz  BMI 34.68 40.98 11.91  Systolic 478 295 621  Diastolic 78 77 93  Pulse 95 90 100  Psychiatric Specialty Exam: Physical Exam  ROS  Blood pressure 105/78, pulse 95, height 5\' 4"  (1.626 m), weight 202 lb 2 oz (91.7 kg), last menstrual period 04/30/2019.Body mass index is 34.69 kg/m.  General Appearance: Casual  Eye Contact:  Fair  Speech:  Clear and Coherent and Pressured  Volume:  Normal  Mood:  Anxious, Hopeless and Irritable  Affect:  Appropriate, Constricted, Full Range and Tearful   Thought Process:  Coherent and Goal Directed  Orientation:  Full (Time, Place, and Person)  Thought Content:  Rumination and Abstract Reasoning  Suicidal Thoughts:  No  Homicidal Thoughts:  No  Memory:  Immediate;   Good Recent;   Good Remote;   Good  Judgement:  Good  Insight:  Good  Psychomotor Activity:  Restlessness  Concentration:  Concentration: Good and Attention Span: Fair  Recall:  [place] of Knowledge:  Good  Language:  Good  Akathisia:  Negative  Handed:  Right  AIMS (if indicated):     Assets:  Communication Skills Desire for Improvement Financial Resources/Insurance Housing Intimacy Leisure Time Physical Health Resilience Social Support Transportation Vocational/Educational  ADL's:  Intact  Cognition:  WNL  Sleep:         Assessment:     depression    Plan:     Discussed depression and anxiety and hormonal influences in detail. Counseled on treatment options and desires an SSRI. Will start prozac at 10mg . Encouraged to increase counseling sessions to weekly for the next 4-6 weeks, and will RTC in 6 weeks for re-evaluation.   >50% of 25 minutes spent in counseling.  Lakeysha Slutsky,CNM

## 2019-06-08 ENCOUNTER — Other Ambulatory Visit: Payer: Self-pay

## 2019-06-08 MED ORDER — FLUOXETINE HCL 10 MG PO CAPS: 10.0000 mg | ORAL_CAPSULE | Freq: Every day | ORAL | 3 refills | Status: DC

## 2019-06-25 ENCOUNTER — Encounter: Payer: BC Managed Care – PPO | Admitting: Obstetrics and Gynecology

## 2019-06-30 ENCOUNTER — Encounter: Payer: BC Managed Care – PPO | Admitting: Certified Nurse Midwife

## 2019-07-13 ENCOUNTER — Telehealth: Payer: Self-pay

## 2019-07-13 NOTE — Telephone Encounter (Signed)
Abnormal period lasting 10 days. Patient was prescribed prozac in Oct. Not sure if the meds are interfering. Pls call patient

## 2019-07-14 ENCOUNTER — Telehealth: Payer: Self-pay | Admitting: Certified Nurse Midwife

## 2019-07-14 NOTE — Telephone Encounter (Signed)
Pt called in for an irreuglar period. Pt has some questions for the nurse. Please advise.

## 2019-07-15 ENCOUNTER — Telehealth: Payer: Self-pay

## 2019-07-16 ENCOUNTER — Other Ambulatory Visit: Payer: Self-pay | Admitting: Certified Nurse Midwife

## 2019-07-16 MED ORDER — TRANEXAMIC ACID 650 MG PO TABS: 1300.0000 mg | ORAL_TABLET | Freq: Three times a day (TID) | ORAL | 0 refills | Status: AC

## 2019-07-16 NOTE — Progress Notes (Signed)
Heavy bleeding with menses. Orders placed for Lysteda per pt request. She denies any contraindications to use.   Philip Aspen, CNM

## 2020-01-04 ENCOUNTER — Other Ambulatory Visit: Payer: Self-pay | Admitting: Internal Medicine

## 2020-01-04 ENCOUNTER — Other Ambulatory Visit: Payer: Self-pay

## 2020-01-04 DIAGNOSIS — Z1231 Encounter for screening mammogram for malignant neoplasm of breast: Secondary | ICD-10-CM

## 2020-01-04 MED ORDER — VITAMIN D (ERGOCALCIFEROL) 1.25 MG (50000 UNIT) PO CAPS: 50000.0000 [IU] | ORAL_CAPSULE | ORAL | 3 refills | Status: DC

## 2020-01-05 ENCOUNTER — Other Ambulatory Visit: Payer: Self-pay

## 2020-01-05 ENCOUNTER — Ambulatory Visit
Admission: RE | Admit: 2020-01-05 | Discharge: 2020-01-05 | Disposition: A | Discharge status: Home / Self Care | Payer: BC Managed Care – PPO | Source: Ambulatory Visit | Attending: Internal Medicine | Admitting: Internal Medicine

## 2020-01-05 DIAGNOSIS — Z1231 Encounter for screening mammogram for malignant neoplasm of breast: Secondary | ICD-10-CM | POA: Insufficient documentation

## 2020-02-03 ENCOUNTER — Encounter: Payer: BC Managed Care – PPO | Admitting: Obstetrics and Gynecology

## 2020-02-09 ENCOUNTER — Encounter: Payer: Self-pay | Admitting: Certified Nurse Midwife

## 2020-02-09 ENCOUNTER — Other Ambulatory Visit: Payer: Self-pay

## 2020-02-09 ENCOUNTER — Ambulatory Visit (INDEPENDENT_AMBULATORY_CARE_PROVIDER_SITE_OTHER): Payer: BC Managed Care – PPO | Admitting: Certified Nurse Midwife

## 2020-02-09 VITALS — BP 99/70 | HR 81 | Ht 64.0 in | Wt 184.1 lb

## 2020-02-09 DIAGNOSIS — Z01419 Encounter for gynecological examination (general) (routine) without abnormal findings: Secondary | ICD-10-CM | POA: Diagnosis not present

## 2020-02-09 MED ORDER — LEVONORGEST-ETH ESTRAD 91-DAY 0.1-0.02 & 0.01 MG PO TABS: 1.0000 | ORAL_TABLET | Freq: Every day | ORAL | 4 refills | Status: DC

## 2020-02-09 NOTE — Progress Notes (Signed)
GYNECOLOGY ANNUAL PREVENTATIVE CARE ENCOUNTER NOTE  History:     Kelsey Baldwin is a 54 y.o. G76P3003 female here for a routine annual gynecologic exam.  Current complaints: irregular, heavey periods. She state she stopped birth control to see if she was in menopause. She has continued to have periods and would like to start back on the pill. .   Denies abnormal vaginal bleeding, discharge, pelvic pain, problems with intercourse or other gynecologic concerns.     Social History: Sexual: heterosexual Marital Status: married Living situation: with spouse Exercise: occasional walks dog, joined [employer] Occupation: homemaker, in classes for administration  Tobacco/alcohol: no tobacco use Illicit drugs: no history of illicit drug use  Gynecologic History Patient's last menstrual period was 01/19/2020. Contraception: none Last Pap: 09/06/2016. Results were: normal with negative HPV Last mammogram: 01/2020. Results were: normal  Obstetric History OB History  Gravida Para Term Preterm AB Living  3 3 3     3   SAB TAB Ectopic Multiple Live Births          3    # Outcome Date GA Lbr Len/2nd Weight Sex Delivery Anes PTL Lv  3 Term 09/22/98   7 lb 8 oz (3.402 kg) F Vag-Spont  N LIV  2 Term 03/27/93   7 lb 7 oz (3.374 kg) F Vag-Spont  N LIV  1 Term 12/29/86   7 lb 6 oz (3.345 kg) F Vag-Spont  N LIV    Past Medical History:  Diagnosis Date   Diabetes mellitus without complication (HCC)    GERD (gastroesophageal reflux disease)    H/O seasonal allergies     Past Surgical History:  Procedure Laterality Date   COLONOSCOPY     COLONOSCOPY WITH PROPOFOL N/A 12/19/2016   Procedure: COLONOSCOPY WITH PROPOFOL;  Surgeon: 12/21/2016, MD;  Location: [facility];  Service: Endoscopy;  Laterality: N/A;   WISDOM TOOTH EXTRACTION      Current Outpatient Medications on File Prior to Visit  Medication Sig Dispense Refill   Ascorbic Acid (VITAMIN C) 1000 MG tablet Take  1,000 mg by mouth daily.     buPROPion (WELLBUTRIN XL) 150 MG 24 hr tablet Take by mouth.     fluticasone (FLONASE) 50 MCG/ACT nasal spray Place into both nostrils daily.     hydrochlorothiazide (MICROZIDE) 12.5 MG capsule TAKE 1 CAPSULE (12.5 MG TOTAL) BY MOUTH EVERY MORNING FOR BLOOD PRESSURE     levocetirizine (XYZAL) 5 MG tablet Take 5 mg by mouth every evening.     metFORMIN (GLUCOPHAGE-XR) 500 MG 24 hr tablet Take by mouth.     montelukast (SINGULAIR) 10 MG tablet Take 10 mg by mouth at bedtime.     Multiple Vitamin (MULTIVITAMIN) tablet Take 1 tablet by mouth daily.     omeprazole (PRILOSEC) 20 MG capsule TAKE 1 CAPSULE BY MOUTH EVERY DAY 90 capsule 4   vitamin B-12 (CYANOCOBALAMIN) 1000 MCG tablet Take 1,000 mcg by mouth 3 (three) times daily.     Vitamin D, Ergocalciferol, (DRISDOL) 1.25 MG (50000 UNIT) CAPS capsule Take 1 capsule (50,000 Units total) by mouth 2 (two) times a week. 24 capsule 3   No current facility-administered medications on file prior to visit.    No Known Allergies  Social History:  reports that she has never smoked. She has never used smokeless tobacco. She reports current alcohol use. She reports that she does not use drugs.  Family History  Problem Relation Age of Onset  Diabetes Mother    Dementia Mother    Cancer Father        lung    Breast cancer Paternal Aunt     The following portions of the patient's history were reviewed and updated as appropriate: allergies, current medications, past family history, past medical history, past social history, past surgical history and problem list.  Review of Systems Pertinent items noted in HPI and remainder of comprehensive ROS otherwise negative.  Physical Exam:  BP 99/70    Pulse 81    Ht 5\' 4"  (1.626 m)    Wt 184 lb 2 oz (83.5 kg)    LMP 01/19/2020    BMI 31.60 kg/m  CONSTITUTIONAL: Well-developed, well-nourished female in no acute distress.  HENT:  Normocephalic, atraumatic, External  right and left ear normal. Oropharynx is clear and moist EYES: Conjunctivae and EOM are normal. Pupils are equal, round, and reactive to light. No scleral icterus.  NECK: Normal range of motion, supple, no masses.  Normal thyroid.  SKIN: Skin is warm and dry. No rash noted. Not diaphoretic. No erythema. No pallor. MUSCULOSKELETAL: Normal range of motion. No tenderness.  No cyanosis, clubbing, or edema.  2+ distal pulses. NEUROLOGIC: Alert and oriented to person, place, and time. Normal reflexes, muscle tone coordination.  PSYCHIATRIC: Normal mood and affect. Normal behavior. Normal judgment and thought content. CARDIOVASCULAR: Normal heart rate noted, regular rhythm RESPIRATORY: Clear to auscultation bilaterally. Effort and breath sounds normal, no problems with respiration noted. BREASTS: Symmetric in size. No masses, tenderness, skin changes, nipple drainage, or lymphadenopathy bilaterally.  ABDOMEN: Soft, no distention noted.  No tenderness, rebound or guarding.  PELVIC: Normal appearing external genitalia and urethral meatus; normal appearing vaginal mucosa and cervix.  No abnormal discharge noted.  Pap smear not indicated.  Normal uterine size, no other palpable masses, no uterine or adnexal tenderness.    Assessment and Plan:    1. Women's annual routine gynecological examination   Pap not due. Mammogram done 01/2020 Labs: Hem A1c. Declines HIV and Hep C.  Refills: ocp's Referrals/orders:none Discussed risks and benefits of OCP to control period given pt age and hx hypertension. She has BP cuff at home and will check BP after starting. She states she was on BP meds and OCP in past without any problems. She verbaizes understading of potential risks and would like to try OCPs .  Routine preventative health maintenance measures emphasized. Please refer to After Visit Summary for other counseling recommendations.     02/2020, NCM

## 2020-02-09 NOTE — Patient Instructions (Signed)

## 2020-02-10 LAB — HEMOGLOBIN A1C
Est. average glucose Bld gHb Est-mCnc: 120 mg/dL
Hgb A1c MFr Bld: 5.8 % — ABNORMAL HIGH (ref 4.8–5.6)

## 2021-01-12 ENCOUNTER — Other Ambulatory Visit: Payer: Self-pay | Admitting: Certified Nurse Midwife

## 2021-01-15 NOTE — Telephone Encounter (Signed)
Please advise on refills.  

## 2021-01-26 ENCOUNTER — Telehealth: Payer: Self-pay | Admitting: Certified Nurse Midwife

## 2021-01-26 NOTE — Telephone Encounter (Signed)
Pt states she started ocp 8 weeks ago. She did not take ocp last night.  She also started a new job 4 weeks ago which has been very stressful. H/o HTN.   She has had elevated bp recently. 1 week of h/a no visual changes.   Advised pt to keep bp log for the next few days. Pt can stop ocp if desires. Aware to use back up plan. Appt made with AT on 6/24 at 1000.

## 2021-01-26 NOTE — Telephone Encounter (Signed)
Patient states that she has been taking a bith control prescribed by Pattricia Boss for 8 weeks now.  She stated that her blood pressure started to increase on 01/25/21.  Her blood pressure was 184/109 on 01/25/21.  Today it has been 154/86 at 4:00am and at 9:15am 149/94.  She is not on any blood pressure meds since March 2021.

## 2021-01-29 ENCOUNTER — Ambulatory Visit: Payer: BC Managed Care – PPO | Admitting: Certified Nurse Midwife

## 2021-01-29 ENCOUNTER — Encounter: Payer: Self-pay | Admitting: Certified Nurse Midwife

## 2021-01-29 ENCOUNTER — Other Ambulatory Visit: Payer: Self-pay

## 2021-01-29 VITALS — BP 123/82 | HR 77 | Ht 64.0 in | Wt 183.6 lb

## 2021-01-29 DIAGNOSIS — Z3009 Encounter for other general counseling and advice on contraception: Secondary | ICD-10-CM

## 2021-01-29 NOTE — Patient Instructions (Signed)

## 2021-01-29 NOTE — Progress Notes (Signed)
  Medication Management Clinic Visit Note  Patient: Kelsey Baldwin MRN: [id number] Date of Birth: 03-06-1966 PCP: Mickey Farber, MD   Vernelle Emerald 55 y.o. female presents for medication management visit today. Pt state she was started on BP medications a few weeks ago by her PCP. She has history of hypertension but it had resolved and recently has returned. She states she has concern about OCP with her elevated bp. Her BP today was normal. She has also been having headaches which she states maybe related to stress. States she has had a lot of stress recently with her health and job change.  BP 123/82   Pulse 77   Ht 5\' 4"  (1.626 m)   Wt 183 lb 9.6 oz (83.3 kg)   LMP 11/27/2020   BMI 31.51 kg/m   Patient Information   Past Medical History:  Diagnosis Date   Diabetes mellitus without complication (HCC)    GERD (gastroesophageal reflux disease)    H/O seasonal allergies       Past Surgical History:  Procedure Laterality Date   COLONOSCOPY     COLONOSCOPY WITH PROPOFOL N/A 12/19/2016   Procedure: COLONOSCOPY WITH PROPOFOL;  Surgeon: 12/21/2016, MD;  Location: [facility];  Service: Endoscopy;  Laterality: N/A;   WISDOM TOOTH EXTRACTION       Family History  Problem Relation Age of Onset   Diabetes Mother    Dementia Mother    Cancer Father        lung    Breast cancer Paternal Aunt     New Diagnoses (since last visit): hypertension         Social History   Substance and Sexual Activity  Alcohol Use Yes      Social History   Tobacco Use  Smoking Status Never  Smokeless Tobacco Never      Health Maintenance  Topic Date Due   Pneumococcal Vaccine 67-43 Years old (1 - PCV) Never done   URINE MICROALBUMIN  Never done   HIV Screening  Never done   Hepatitis C Screening  Never done   PAP SMEAR-Modifier  09/07/2019   INFLUENZA VACCINE  03/05/2021   COVID-19 Vaccine (5 - Booster) 03/22/2021   MAMMOGRAM  01/04/2022   COLONOSCOPY (Pts 45-30yrs  Insurance coverage will need to be confirmed)  12/20/2026   TETANUS/TDAP  05/09/2027   Zoster Vaccines- Shingrix  Completed   HPV VACCINES  Aged Out     Assessment and Plan: Discussed that give that she is now on medications for BP we could wait a few weeks to see how if her BP remains stable and symptoms resolve vs changing of progestin only method today. Discussed risks and benefits and progestin only methods including POP, depo injections, nexplanon, IUD. She has been taking BC to control her cycle because they are irregular and heavy. Discussed option of stopping all birth control and evaluating for menopause. She request to continue with OCP, she has annual scheduled in 2-3 wk. We will follow up at that time to see if symptoms resolved and or if she would like to change to a different method. Follow up as scheduled 2 wks for annual exam.   07/09/2027, CNM

## 2021-02-12 ENCOUNTER — Encounter: Payer: BC Managed Care – PPO | Admitting: Certified Nurse Midwife

## 2021-02-27 ENCOUNTER — Encounter: Payer: BC Managed Care – PPO | Admitting: Certified Nurse Midwife

## 2021-02-27 ENCOUNTER — Other Ambulatory Visit: Payer: Self-pay | Admitting: Certified Nurse Midwife

## 2021-03-01 NOTE — Telephone Encounter (Signed)
LVM for patient to call back in regards to Rx refill

## 2021-03-05 NOTE — Telephone Encounter (Signed)
Unable to reach patient in regards to refill; patient needs to be seen for annual.

## 2021-03-08 ENCOUNTER — Telehealth: Payer: Self-pay | Admitting: Certified Nurse Midwife

## 2021-03-08 ENCOUNTER — Other Ambulatory Visit: Payer: Self-pay

## 2021-03-08 MED ORDER — LEVONORGEST-ETH ESTRAD 91-DAY 0.1-0.02 & 0.01 MG PO TABS: 1.0000 | ORAL_TABLET | Freq: Every day | ORAL | 0 refills | Status: DC

## 2021-03-08 NOTE — Telephone Encounter (Signed)
Patient is requesting a refill on Levonorgestrel-Ethinyl Estradiol  Pharmacy: [facility] in [place]   Patient has her AE scheduled for 03/27/21

## 2021-03-08 NOTE — Telephone Encounter (Signed)
Pt aware 1 refill was sent to pharmacy to hold her over til next appointment with [place].

## 2021-03-27 ENCOUNTER — Ambulatory Visit (INDEPENDENT_AMBULATORY_CARE_PROVIDER_SITE_OTHER): Payer: BC Managed Care – PPO | Admitting: Certified Nurse Midwife

## 2021-03-27 ENCOUNTER — Encounter: Payer: Self-pay | Admitting: Certified Nurse Midwife

## 2021-03-27 ENCOUNTER — Other Ambulatory Visit: Payer: Self-pay

## 2021-03-27 VITALS — BP 103/69 | HR 72 | Ht 64.0 in | Wt 187.2 lb

## 2021-03-27 DIAGNOSIS — E669 Obesity, unspecified: Secondary | ICD-10-CM | POA: Diagnosis not present

## 2021-03-27 DIAGNOSIS — Z1231 Encounter for screening mammogram for malignant neoplasm of breast: Secondary | ICD-10-CM | POA: Diagnosis not present

## 2021-03-27 DIAGNOSIS — Z01419 Encounter for gynecological examination (general) (routine) without abnormal findings: Secondary | ICD-10-CM | POA: Diagnosis not present

## 2021-03-27 MED ORDER — LEVONORGEST-ETH ESTRAD 91-DAY 0.1-0.02 & 0.01 MG PO TABS: 1.0000 | ORAL_TABLET | Freq: Every day | ORAL | 3 refills | Status: DC

## 2021-03-27 NOTE — Progress Notes (Signed)
GYNECOLOGY ANNUAL PREVENTATIVE CARE ENCOUNTER NOTE  History:     Kelsey Baldwin is a 55 y.o. G68P3003 female here for a routine annual gynecologic exam.  Current complaints: none.   Denies abnormal vaginal bleeding, discharge, pelvic pain, problems with intercourse or other gynecologic concerns.     Social Relationship: married  Living: with spouse  Work: homemaker , taking classes for administration  Exercise: walking dogs Smoke/Alcohol/drug use:  none  Gynecologic History No LMP recorded (lmp unknown). Contraception: OCP (estrogen/progesterone) Last Pap: 02/03/2017. Results were: normal with negative HPV Last mammogram: 01/2020. Results were: normal  Obstetric History OB History  Gravida Para Term Preterm AB Living  3 3 3     3   SAB IAB Ectopic Multiple Live Births          3    # Outcome Date GA Lbr Len/2nd Weight Sex Delivery Anes PTL Lv  3 Term 09/22/98   7 lb 8 oz (3.402 kg) F Vag-Spont  N LIV  2 Term 03/27/93   7 lb 7 oz (3.374 kg) F Vag-Spont  N LIV  1 Term 12/29/86   7 lb 6 oz (3.345 kg) F Vag-Spont  N LIV    Past Medical History:  Diagnosis Date   Diabetes mellitus without complication (HCC)    GERD (gastroesophageal reflux disease)    H/O seasonal allergies     Past Surgical History:  Procedure Laterality Date   COLONOSCOPY     COLONOSCOPY WITH PROPOFOL N/A 12/19/2016   Procedure: COLONOSCOPY WITH PROPOFOL;  Surgeon: 12/21/2016, MD;  Location: [facility];  Service: Endoscopy;  Laterality: N/A;   WISDOM TOOTH EXTRACTION      Current Outpatient Medications on File Prior to Visit  Medication Sig Dispense Refill   Ascorbic Acid (VITAMIN C) 1000 MG tablet Take 1,000 mg by mouth daily.     fluticasone (FLONASE) 50 MCG/ACT nasal spray Place into both nostrils daily.     hydrochlorothiazide (MICROZIDE) 12.5 MG capsule TAKE 1 CAPSULE (12.5 MG TOTAL) BY MOUTH EVERY MORNING FOR BLOOD PRESSURE     levocetirizine (XYZAL) 5 MG tablet Take 5 mg by mouth  every evening.     Levonorgestrel-Ethinyl Estradiol (CAMRESE LO) 0.1-0.02 & 0.01 MG tablet Take 1 tablet by mouth daily. 91 tablet 0   losartan (COZAAR) 25 MG tablet Take 25 mg by mouth daily.     metoprolol succinate (TOPROL-XL) 25 MG 24 hr tablet Take 25 mg by mouth daily.     montelukast (SINGULAIR) 10 MG tablet Take 10 mg by mouth at bedtime.     Multiple Vitamin (MULTIVITAMIN) tablet Take 1 tablet by mouth daily.     omeprazole (PRILOSEC) 20 MG capsule TAKE 1 CAPSULE BY MOUTH EVERY DAY 90 capsule 4   vitamin B-12 (CYANOCOBALAMIN) 1000 MCG tablet Take 1,000 mcg by mouth 3 (three) times daily.     Vitamin D, Ergocalciferol, (DRISDOL) 1.25 MG (50000 UNIT) CAPS capsule TAKE 1 CAPSULE (50,000 UNITS TOTAL) BY MOUTH 2 (TWO) TIMES A WEEK. 8 capsule 11   buPROPion (WELLBUTRIN XL) 150 MG 24 hr tablet Take by mouth.     metFORMIN (GLUCOPHAGE-XR) 500 MG 24 hr tablet Take by mouth.     No current facility-administered medications on file prior to visit.    No Known Allergies  Social History:  reports that she has never smoked. She has never used smokeless tobacco. She reports current alcohol use. She reports that she does not use drugs.  Family History  Problem Relation Age of Onset   Diabetes Mother    Dementia Mother    Cancer Father        lung    Breast cancer Paternal Aunt     The following portions of the patient's history were reviewed and updated as appropriate: allergies, current medications, past family history, past medical history, past social history, past surgical history and problem list.  Review of Systems Pertinent items noted in HPI and remainder of comprehensive ROS otherwise negative.  Physical Exam:  BP 103/69   Pulse 72   Ht 5\' 4"  (1.626 m)   Wt 187 lb 3.2 oz (84.9 kg)   LMP  (LMP Unknown)   BMI 32.13 kg/m  CONSTITUTIONAL: Well-developed, well-nourished female in no acute distress.  HENT:  Normocephalic, atraumatic, External right and left ear normal.  Oropharynx is clear and moist EYES: Conjunctivae and EOM are normal. Pupils are equal, round, and reactive to light. No scleral icterus.  NECK: Normal range of motion, supple, no masses.  Normal thyroid.  SKIN: Skin is warm and dry. No rash noted. Not diaphoretic. No erythema. No pallor. MUSCULOSKELETAL: Normal range of motion. No tenderness.  No cyanosis, clubbing, or edema.  2+ distal pulses. NEUROLOGIC: Alert and oriented to person, place, and time. Normal reflexes, muscle tone coordination.  PSYCHIATRIC: Normal mood and affect. Normal behavior. Normal judgment and thought content. CARDIOVASCULAR: Normal heart rate noted, regular rhythm RESPIRATORY: Clear to auscultation bilaterally. Effort and breath sounds normal, no problems with respiration noted. BREASTS: Symmetric in size. No masses, tenderness, skin changes, nipple drainage, or lymphadenopathy bilaterally.  ABDOMEN: Soft, no distention noted.  No tenderness, rebound or guarding.  PELVIC: Normal appearing external genitalia and urethral meatus; normal appearing vaginal mucosa and cervix.  No abnormal discharge noted.  Pap smear not due.  Normal uterine size, no other palpable masses, no uterine or adnexal tenderness.  .   Assessment and Plan:    1. Well woman exam with routine gynecological exam    Pap: not due Mammogram : ordered Labs: Hem A1C Refills: OCP-pt taking for menopausal symptom relief, counseled on risks and benefits. Request refill.  Referral: none Routine preventative health maintenance measures emphasized. Please refer to After Visit Summary for other counseling recommendations.      , CNM [facility],  [facility]

## 2021-03-28 LAB — HEMOGLOBIN A1C
Est. average glucose Bld gHb Est-mCnc: 128 mg/dL
Hgb A1c MFr Bld: 6.1 % — ABNORMAL HIGH (ref 4.8–5.6)

## 2021-04-24 DIAGNOSIS — D225 Melanocytic nevi of trunk: Secondary | ICD-10-CM | POA: Diagnosis not present

## 2021-04-24 DIAGNOSIS — L578 Other skin changes due to chronic exposure to nonionizing radiation: Secondary | ICD-10-CM | POA: Diagnosis not present

## 2021-04-24 DIAGNOSIS — L905 Scar conditions and fibrosis of skin: Secondary | ICD-10-CM | POA: Diagnosis not present

## 2021-04-24 DIAGNOSIS — L821 Other seborrheic keratosis: Secondary | ICD-10-CM | POA: Diagnosis not present

## 2021-05-11 DIAGNOSIS — R000 Tachycardia, unspecified: Secondary | ICD-10-CM | POA: Diagnosis not present

## 2021-05-11 DIAGNOSIS — E1159 Type 2 diabetes mellitus with other circulatory complications: Secondary | ICD-10-CM | POA: Diagnosis not present

## 2021-05-11 DIAGNOSIS — G4739 Other sleep apnea: Secondary | ICD-10-CM | POA: Diagnosis not present

## 2021-05-11 DIAGNOSIS — Z1231 Encounter for screening mammogram for malignant neoplasm of breast: Secondary | ICD-10-CM | POA: Diagnosis not present

## 2021-05-11 DIAGNOSIS — I152 Hypertension secondary to endocrine disorders: Secondary | ICD-10-CM | POA: Diagnosis not present

## 2021-09-09 DIAGNOSIS — G4733 Obstructive sleep apnea (adult) (pediatric): Secondary | ICD-10-CM | POA: Diagnosis not present

## 2021-09-25 DIAGNOSIS — F321 Major depressive disorder, single episode, moderate: Secondary | ICD-10-CM | POA: Diagnosis not present

## 2021-10-03 DIAGNOSIS — F321 Major depressive disorder, single episode, moderate: Secondary | ICD-10-CM | POA: Diagnosis not present

## 2021-10-08 ENCOUNTER — Other Ambulatory Visit: Payer: Self-pay | Admitting: Gerontology

## 2021-10-08 DIAGNOSIS — Z1231 Encounter for screening mammogram for malignant neoplasm of breast: Secondary | ICD-10-CM

## 2021-10-09 DIAGNOSIS — F321 Major depressive disorder, single episode, moderate: Secondary | ICD-10-CM | POA: Diagnosis not present

## 2021-10-15 DIAGNOSIS — F321 Major depressive disorder, single episode, moderate: Secondary | ICD-10-CM | POA: Diagnosis not present

## 2021-10-24 ENCOUNTER — Ambulatory Visit: Payer: BC Managed Care – PPO

## 2021-10-24 DIAGNOSIS — F321 Major depressive disorder, single episode, moderate: Secondary | ICD-10-CM | POA: Diagnosis not present

## 2021-10-31 DIAGNOSIS — F321 Major depressive disorder, single episode, moderate: Secondary | ICD-10-CM | POA: Diagnosis not present

## 2021-11-21 DIAGNOSIS — F321 Major depressive disorder, single episode, moderate: Secondary | ICD-10-CM | POA: Diagnosis not present

## 2021-11-27 ENCOUNTER — Ambulatory Visit: Payer: BC Managed Care – PPO

## 2021-11-28 DIAGNOSIS — F321 Major depressive disorder, single episode, moderate: Secondary | ICD-10-CM | POA: Diagnosis not present

## 2021-11-29 ENCOUNTER — Ambulatory Visit
Admission: EM | Admit: 2021-11-29 | Discharge: 2021-11-29 | Disposition: A | Discharge status: Home / Self Care | Payer: BC Managed Care – PPO | Attending: Emergency Medicine | Admitting: Emergency Medicine

## 2021-11-29 ENCOUNTER — Encounter: Payer: Self-pay | Admitting: Emergency Medicine

## 2021-11-29 DIAGNOSIS — R519 Headache, unspecified: Secondary | ICD-10-CM | POA: Diagnosis not present

## 2021-11-29 DIAGNOSIS — H9311 Tinnitus, right ear: Secondary | ICD-10-CM

## 2021-11-29 MED ORDER — IBUPROFEN 600 MG PO TABS: 600.0000 mg | ORAL_TABLET | Freq: Four times a day (QID) | ORAL | 0 refills | Status: AC | PRN

## 2021-11-29 NOTE — Discharge Instructions (Addendum)
I have placed a referral to Dr. Tami Ribas and have asked that you be evaluated within the week.  However, contact his office and try and arrange an appointment to be seen as soon as you can start saline nasal irrigation with a NeilMed sinus rinse and distilled water as often as you want.  Continue the Flonase, Singulair and Xyzal.  Continue Excedrin for the headache.  If it stops working, try ibuprofen 600 mg combined with 1000 mg of Tylenol together 3-4 times a day. ?

## 2021-11-29 NOTE — ED Provider Notes (Signed)
HPI ? ?SUBJECTIVE: ? ?Kelsey Baldwin is a 56 y.o. female who presents with  an intermittent pulsing, throbbing sensation in her right ear that last about 15 minutes and resolves starting today.  She reports bilateral ear crackling and decreased hearing for the past several weeks since having COVID at the beginning of the month.  She reports continued nasal congestion, postnasal drip, clear rhinorrhea, mild sneezing consistent with allergies that bother her around this time of year, and some mild sneezing.  She has been taking Flonase, Xyzal, and Singulair.   No vertigo, jaw popping or clicking, facial pain or rash.  She has been wearing ear buds more recently.  She also uses Q-tips.  She has not tried anything for her ear symptoms.  There are no aggravating or alleviating factors.   ? ?She also reports a constant, gradual onset headache located at the top of her head which is consistent with previous headaches starting the day after eating a high sodium meal.  It is not the worst headache that she has ever had.  No fevers, nausea, vomiting, neck stiffness, rash, phonophobia, photophobia.  She tried Excedrin with improvement in her headache.  She took this within 6 hours of evaluation.  She has a past medical history of hypertension, diabetes, COVID.  No history of hypercholesterolemia, CVA, carotid artery disease.  [facility] ? ? ? ?Past Medical History:  ?Diagnosis Date  ? Diabetes mellitus without complication (HCC)   ? GERD (gastroesophageal reflux disease)   ? H/O seasonal allergies   ? ? ?Past Surgical History:  ?Procedure Laterality Date  ? COLONOSCOPY    ? COLONOSCOPY WITH PROPOFOL N/A 12/19/2016  ? Procedure: COLONOSCOPY WITH PROPOFOL;  Surgeon: Midge MiniumWohl, Darren, MD;  [facility];  Service: Endoscopy;  Laterality: N/A;  ? WISDOM TOOTH EXTRACTION    ? ? ?Family History  ?Problem Relation Age of Onset  ? Diabetes Mother   ? Dementia Mother   ? Cancer Father   ?     lung   ? Breast  cancer Paternal Aunt   ? ? ?Social History  ? ?Tobacco Use  ? Smoking status: Never  ? Smokeless tobacco: Never  ?Substance Use Topics  ? Alcohol use: Yes  ? Drug use: No  ? ? ?No current facility-administered medications for this encounter. ? ?Current Outpatient Medications:  ?  ibuprofen (ADVIL) 600 MG tablet, Take 1 tablet (600 mg total) by mouth every 6 (six) hours as needed., Disp: 30 tablet, Rfl: 0 ?  Ascorbic Acid (VITAMIN C) 1000 MG tablet, Take 1,000 mg by mouth daily., Disp: , Rfl:  ?  buPROPion (WELLBUTRIN XL) 150 MG 24 hr tablet, Take by mouth., Disp: , Rfl:  ?  fluticasone (FLONASE) 50 MCG/ACT nasal spray, Place into both nostrils daily., Disp: , Rfl:  ?  hydrochlorothiazide (MICROZIDE) 12.5 MG capsule, TAKE 1 CAPSULE (12.5 MG TOTAL) BY MOUTH EVERY MORNING FOR BLOOD PRESSURE, Disp: , Rfl:  ?  levocetirizine (XYZAL) 5 MG tablet, Take 5 mg by mouth every evening., Disp: , Rfl:  ?  Levonorgestrel-Ethinyl Estradiol (CAMRESE LO) 0.1-0.02 & 0.01 MG tablet, Take 1 tablet by mouth daily., Disp: 91 tablet, Rfl: 3 ?  losartan (COZAAR) 25 MG tablet, Take 25 mg by mouth daily., Disp: , Rfl:  ?  metFORMIN (GLUCOPHAGE-XR) 500 MG 24 hr tablet, Take by mouth., Disp: , Rfl:  ?  metoprolol succinate (TOPROL-XL) 25 MG 24 hr tablet, Take 25 mg by mouth daily., Disp: , Rfl:  ?  montelukast (SINGULAIR) 10 MG tablet, Take 10 mg by mouth at bedtime., Disp: , Rfl:  ?  Multiple Vitamin (MULTIVITAMIN) tablet, Take 1 tablet by mouth daily., Disp: , Rfl:  ?  omeprazole (PRILOSEC) 20 MG capsule, TAKE 1 CAPSULE BY MOUTH EVERY DAY, Disp: 90 capsule, Rfl: 4 ?  vitamin B-12 (CYANOCOBALAMIN) 1000 MCG tablet, Take 1,000 mcg by mouth 3 (three) times daily., Disp: , Rfl:  ?  Vitamin D, Ergocalciferol, (DRISDOL) 1.25 MG (50000 UNIT) CAPS capsule, TAKE 1 CAPSULE (50,000 UNITS TOTAL) BY MOUTH 2 (TWO) TIMES A WEEK., Disp: 8 capsule, Rfl: 11 ? ?No Known Allergies ? ? ?ROS ? ?As noted in HPI.  ? ?Physical Exam ? ?BP (!) 148/88 (BP Location: Right  Arm)   Pulse 68   Temp 98.1 ?F (36.7 ?C) (Oral)   Resp 18   SpO2 100%  ? ?Constitutional: Well developed, well nourished, no acute distress ?Eyes:  EOMI, conjunctiva normal bilaterally ?HENT: Normocephalic, atraumatic,mucus membranes moist.  Right external ear, EAC normal.  TMs normal bilaterally.  No pain with traction on pinna, palpation of tragus or mastoid right ear.  TMJ nontender bilaterally.  No crepitus.  Decreased hearing right ear compared to left.  No maxillary, frontal sinus tenderness.  Normal turbinates.  Mucoid nasal congestion. ?Respiratory: Normal inspiratory effort ?Cardiovascular: Normal rate, regular rhythm.  No carotid bruit. ?GI: nondistended ?skin: No rash, skin intact ?Musculoskeletal: no deformities ?Neurologic: Alert & oriented x 3, no focal neuro deficits, cranial nerves III through XII grossly intact, motor coordination intact. ?Psychiatric: Speech and behavior appropriate ? ? ?ED Course ? ? ?Medications - No data to display ? ?Orders Placed This Encounter  ?Procedures  ? Ambulatory referral to ENT  ?  Referral Priority:   Routine  ?  Referral Type:   Consultation  ?  Referral Reason:   Specialty Services Required  ?  Referred to Provider:   Linus Salmons, MD  ?  Requested Specialty:   Otolaryngology  ?  Number of Visits Requested:   1  ? ? ?No results found for this or any previous visit (from the past 24 hour(s)). ?No results found. ? ?ED Clinical Impression ? ?1. Tinnitus of right ear   ?2. Acute nonintractable headache, unspecified headache type   ?  ? ?ED Assessment/Plan ? ?Suspect the crackling in her ear is from eustachian tube dysfunction, however, she is already doing all of the appropriate medications for this.  We will have her start saline nasal irrigation as well.  Unsure as to the cause of the pulsatile tinnitus, there is no carotid bruit or evidence of a stroke.  Placing referral to ENT.  She has a headache, but states that it started the day after eating a very  salty meal and it improves with Excedrin.  She has had headaches like this before.  It is not the worst headache that she has ever had.  Advised her to push fluids, continue Excedrin.  If this does not work, then we can try Tylenol/ibuprofen.   ? ?Discussed MDM, treatment plan, and plan for follow-up with patient. Discussed sn/sx that should prompt return to the ED. patient agrees with plan.  ? ?Meds ordered this encounter  ?Medications  ? ibuprofen (ADVIL) 600 MG tablet  ?  Sig: Take 1 tablet (600 mg total) by mouth every 6 (six) hours as needed.  ?  Dispense:  30 tablet  ?  Refill:  0  ? ? ? ? ?*This clinic note was created using  Scientist, clinical (histocompatibility and immunogenetics). Therefore, there may be occasional mistakes despite careful proofreading. ? ?? ? ?  ?Domenick Gong, MD ?11/29/21 1700 ? ?

## 2021-11-29 NOTE — ED Triage Notes (Signed)
Pt reports bilateral ear "crackling" since having Covid at the beginning of the month. Also reports a pulsating sensation in right ear beginning today. ?

## 2021-12-05 DIAGNOSIS — F321 Major depressive disorder, single episode, moderate: Secondary | ICD-10-CM | POA: Diagnosis not present

## 2021-12-12 DIAGNOSIS — F321 Major depressive disorder, single episode, moderate: Secondary | ICD-10-CM | POA: Diagnosis not present

## 2021-12-19 DIAGNOSIS — F321 Major depressive disorder, single episode, moderate: Secondary | ICD-10-CM | POA: Diagnosis not present

## 2021-12-26 DIAGNOSIS — F321 Major depressive disorder, single episode, moderate: Secondary | ICD-10-CM | POA: Diagnosis not present

## 2022-01-01 ENCOUNTER — Ambulatory Visit
Admission: RE | Admit: 2022-01-01 | Discharge: 2022-01-01 | Disposition: A | Discharge status: Home / Self Care | Payer: BC Managed Care – PPO | Source: Ambulatory Visit | Attending: Gerontology | Admitting: Gerontology

## 2022-01-01 ENCOUNTER — Other Ambulatory Visit: Payer: Self-pay | Admitting: Certified Nurse Midwife

## 2022-01-01 DIAGNOSIS — Z1231 Encounter for screening mammogram for malignant neoplasm of breast: Secondary | ICD-10-CM | POA: Diagnosis not present

## 2022-01-02 DIAGNOSIS — F321 Major depressive disorder, single episode, moderate: Secondary | ICD-10-CM | POA: Diagnosis not present

## 2022-01-16 DIAGNOSIS — F321 Major depressive disorder, single episode, moderate: Secondary | ICD-10-CM | POA: Diagnosis not present

## 2022-01-23 DIAGNOSIS — F321 Major depressive disorder, single episode, moderate: Secondary | ICD-10-CM | POA: Diagnosis not present

## 2022-01-28 DIAGNOSIS — E781 Pure hyperglyceridemia: Secondary | ICD-10-CM | POA: Diagnosis not present

## 2022-01-28 DIAGNOSIS — Z1329 Encounter for screening for other suspected endocrine disorder: Secondary | ICD-10-CM | POA: Diagnosis not present

## 2022-01-28 DIAGNOSIS — F331 Major depressive disorder, recurrent, moderate: Secondary | ICD-10-CM | POA: Diagnosis not present

## 2022-01-28 DIAGNOSIS — J3089 Other allergic rhinitis: Secondary | ICD-10-CM | POA: Diagnosis not present

## 2022-01-28 DIAGNOSIS — E1159 Type 2 diabetes mellitus with other circulatory complications: Secondary | ICD-10-CM | POA: Diagnosis not present

## 2022-01-28 DIAGNOSIS — R000 Tachycardia, unspecified: Secondary | ICD-10-CM | POA: Diagnosis not present

## 2022-01-28 DIAGNOSIS — I152 Hypertension secondary to endocrine disorders: Secondary | ICD-10-CM | POA: Diagnosis not present

## 2022-01-28 DIAGNOSIS — E559 Vitamin D deficiency, unspecified: Secondary | ICD-10-CM | POA: Diagnosis not present

## 2022-01-28 DIAGNOSIS — E1169 Type 2 diabetes mellitus with other specified complication: Secondary | ICD-10-CM | POA: Diagnosis not present

## 2022-01-28 DIAGNOSIS — Z0000 Encounter for general adult medical examination without abnormal findings: Secondary | ICD-10-CM | POA: Diagnosis not present

## 2022-01-30 DIAGNOSIS — F321 Major depressive disorder, single episode, moderate: Secondary | ICD-10-CM | POA: Diagnosis not present

## 2022-02-06 DIAGNOSIS — F321 Major depressive disorder, single episode, moderate: Secondary | ICD-10-CM | POA: Diagnosis not present

## 2022-02-20 DIAGNOSIS — F321 Major depressive disorder, single episode, moderate: Secondary | ICD-10-CM | POA: Diagnosis not present

## 2022-02-28 DIAGNOSIS — R202 Paresthesia of skin: Secondary | ICD-10-CM | POA: Diagnosis not present

## 2022-02-28 DIAGNOSIS — E781 Pure hyperglyceridemia: Secondary | ICD-10-CM | POA: Diagnosis not present

## 2022-02-28 DIAGNOSIS — E1169 Type 2 diabetes mellitus with other specified complication: Secondary | ICD-10-CM | POA: Diagnosis not present

## 2022-02-28 DIAGNOSIS — E669 Obesity, unspecified: Secondary | ICD-10-CM | POA: Diagnosis not present

## 2022-03-06 DIAGNOSIS — F321 Major depressive disorder, single episode, moderate: Secondary | ICD-10-CM | POA: Diagnosis not present

## 2022-03-20 DIAGNOSIS — F321 Major depressive disorder, single episode, moderate: Secondary | ICD-10-CM | POA: Diagnosis not present

## 2022-03-25 DIAGNOSIS — M542 Cervicalgia: Secondary | ICD-10-CM | POA: Diagnosis not present

## 2022-03-25 DIAGNOSIS — M5412 Radiculopathy, cervical region: Secondary | ICD-10-CM | POA: Diagnosis not present

## 2022-04-01 DIAGNOSIS — F321 Major depressive disorder, single episode, moderate: Secondary | ICD-10-CM | POA: Diagnosis not present

## 2022-04-02 DIAGNOSIS — M5412 Radiculopathy, cervical region: Secondary | ICD-10-CM | POA: Diagnosis not present

## 2022-04-02 DIAGNOSIS — M542 Cervicalgia: Secondary | ICD-10-CM | POA: Diagnosis not present

## 2022-04-03 ENCOUNTER — Encounter: Payer: BC Managed Care – PPO | Admitting: Certified Nurse Midwife

## 2022-04-05 DIAGNOSIS — M542 Cervicalgia: Secondary | ICD-10-CM | POA: Diagnosis not present

## 2022-04-05 DIAGNOSIS — M5412 Radiculopathy, cervical region: Secondary | ICD-10-CM | POA: Diagnosis not present

## 2022-04-09 DIAGNOSIS — M5412 Radiculopathy, cervical region: Secondary | ICD-10-CM | POA: Diagnosis not present

## 2022-04-09 DIAGNOSIS — M542 Cervicalgia: Secondary | ICD-10-CM | POA: Diagnosis not present

## 2022-04-12 ENCOUNTER — Other Ambulatory Visit (HOSPITAL_COMMUNITY)
Admission: RE | Admit: 2022-04-12 | Discharge: 2022-04-12 | Disposition: A | Discharge status: Home / Self Care | Payer: BC Managed Care – PPO | Source: Ambulatory Visit | Attending: Certified Nurse Midwife | Admitting: Certified Nurse Midwife

## 2022-04-12 ENCOUNTER — Encounter: Payer: Self-pay | Admitting: Certified Nurse Midwife

## 2022-04-12 ENCOUNTER — Ambulatory Visit (INDEPENDENT_AMBULATORY_CARE_PROVIDER_SITE_OTHER): Payer: BC Managed Care – PPO | Admitting: Certified Nurse Midwife

## 2022-04-12 VITALS — BP 93/62 | HR 76 | Ht 64.0 in | Wt 193.0 lb

## 2022-04-12 DIAGNOSIS — Z124 Encounter for screening for malignant neoplasm of cervix: Secondary | ICD-10-CM | POA: Insufficient documentation

## 2022-04-12 DIAGNOSIS — Z01419 Encounter for gynecological examination (general) (routine) without abnormal findings: Secondary | ICD-10-CM | POA: Insufficient documentation

## 2022-04-12 DIAGNOSIS — Z1231 Encounter for screening mammogram for malignant neoplasm of breast: Secondary | ICD-10-CM

## 2022-04-12 DIAGNOSIS — Z8639 Personal history of other endocrine, nutritional and metabolic disease: Secondary | ICD-10-CM

## 2022-04-12 NOTE — Patient Instructions (Signed)
Preventive Care 56-56 Years Old, Female Preventive care refers to lifestyle choices and visits with your health care provider that can promote health and wellness. Preventive care visits are also called wellness exams. What can I expect for my preventive care visit? Counseling Your health care provider may ask you questions about your: Medical history, including: Past medical problems. Family medical history. Pregnancy history. Current health, including: Menstrual cycle. Method of birth control. Emotional well-being. Home life and relationship well-being. Sexual activity and sexual health. Lifestyle, including: Alcohol, nicotine or tobacco, and drug use. Access to firearms. Diet, exercise, and sleep habits. Work and work environment. Sunscreen use. Safety issues such as seatbelt and bike helmet use. Physical exam Your health care provider will check your: Height and weight. These may be used to calculate your BMI (body mass index). BMI is a measurement that tells if you are at a healthy weight. Waist circumference. This measures the distance around your waistline. This measurement also tells if you are at a healthy weight and may help predict your risk of certain diseases, such as type 2 diabetes and high blood pressure. Heart rate and blood pressure. Body temperature. Skin for abnormal spots. What immunizations do I need?  Vaccines are usually given at various ages, according to a schedule. Your health care provider will recommend vaccines for you based on your age, medical history, and lifestyle or other factors, such as travel or where you work. What tests do I need? Screening Your health care provider may recommend screening tests for certain conditions. This may include: Lipid and cholesterol levels. Diabetes screening. This is done by checking your blood sugar (glucose) after you have not eaten for a while (fasting). Pelvic exam and Pap test. Hepatitis B test. Hepatitis C  test. HIV (human immunodeficiency virus) test. STI (sexually transmitted infection) testing, if you are at risk. Lung cancer screening. Colorectal cancer screening. Mammogram. Talk with your health care provider about when you should start having regular mammograms. This may depend on whether you have a family history of breast cancer. BRCA-related cancer screening. This may be done if you have a family history of breast, ovarian, tubal, or peritoneal cancers. Bone density scan. This is done to screen for osteoporosis. Talk with your health care provider about your test results, treatment options, and if necessary, the need for more tests. Follow these instructions at home: Eating and drinking  Eat a diet that includes fresh fruits and vegetables, whole grains, lean protein, and low-fat dairy products. Take vitamin and mineral supplements as recommended by your health care provider. Do not drink alcohol if: Your health care provider tells you not to drink. You are pregnant, may be pregnant, or are planning to become pregnant. If you drink alcohol: Limit how much you have to 0-1 drink a day. Know how much alcohol is in your drink. In the [place] one drink equals one 12 oz bottle of beer (355 mL), one 5 oz glass of wine (148 mL), or one 1 oz glass of hard liquor (44 mL). Lifestyle Brush your teeth every morning and night with fluoride toothpaste. Floss one time each day. Exercise for at least 30 minutes 5 or more days each week. Do not use any products that contain nicotine or tobacco. These products include cigarettes, chewing tobacco, and vaping devices, such as e-cigarettes. If you need help quitting, ask your health care provider. Do not use drugs. If you are sexually active, practice safe sex. Use a condom or other form of protection to   prevent STIs. If you do not wish to become pregnant, use a form of birth control. If you plan to become pregnant, see your health care provider for a  prepregnancy visit. Take aspirin only as told by your health care provider. Make sure that you understand how much to take and what form to take. Work with your health care provider to find out whether it is safe and beneficial for you to take aspirin daily. Find healthy ways to manage stress, such as: Meditation, yoga, or listening to music. Journaling. Talking to a trusted person. Spending time with friends and family. Minimize exposure to UV radiation to reduce your risk of skin cancer. Safety Always wear your seat belt while driving or riding in a vehicle. Do not drive: If you have been drinking alcohol. Do not ride with someone who has been drinking. When you are tired or distracted. While texting. If you have been using any mind-altering substances or drugs. Wear a helmet and other protective equipment during sports activities. If you have firearms in your house, make sure you follow all gun safety procedures. Seek help if you have been physically or sexually abused. What's next? Visit your health care provider once a year for an annual wellness visit. Ask your health care provider how often you should have your eyes and teeth checked. Stay up to date on all vaccines. This information is not intended to replace advice given to you by your health care provider. Make sure you discuss any questions you have with your health care provider. Document Revised: 01/17/2021 Document Reviewed: 01/17/2021 Elsevier Patient Education  [place].

## 2022-04-12 NOTE — Progress Notes (Signed)
GYNECOLOGY ANNUAL PREVENTATIVE CARE ENCOUNTER NOTE  History:     Kelsey Baldwin is a 56 y.o. [id number] female here for a routine annual gynecologic exam.  Current complaints: none.   Denies abnormal vaginal bleeding, discharge, pelvic pain, problems with intercourse or other gynecologic concerns.     Social Relationship: married Living: with spouse  Work: Arts development officer, taking classes for administration  Exercise: walks dogs Smoke/Alcohol/drug use: denies   Gynecologic History Patient's last menstrual period was 03/30/2022 (exact date). Contraception: none ( has had one period since stopping OCP) Last Pap: 02/03/2017. Results were: normal with negative HPV Last mammogram: 01/01/2322. Results were: normal  Obstetric History OB History  Gravida Para Term Preterm AB Living  3 3 3     3   SAB IAB Ectopic Multiple Live Births          3    # Outcome Date GA Lbr Len/2nd Weight Sex Delivery Anes PTL Lv  3 Term 09/22/98   7 lb 8 oz (3.402 kg) F Vag-Spont  N LIV  2 Term 03/27/93   7 lb 7 oz (3.374 kg) F Vag-Spont  N LIV  1 Term 12/29/86   7 lb 6 oz (3.345 kg) F Vag-Spont  N LIV    Past Medical History:  Diagnosis Date   Diabetes mellitus without complication (HCC)    GERD (gastroesophageal reflux disease)    H/O seasonal allergies     Past Surgical History:  Procedure Laterality Date   COLONOSCOPY     COLONOSCOPY WITH PROPOFOL N/A 12/19/2016   Procedure: COLONOSCOPY WITH PROPOFOL;  Surgeon: 12/21/2016, MD;  Location: [facility];  Service: Endoscopy;  Laterality: N/A;   WISDOM TOOTH EXTRACTION      Current Outpatient Medications on File Prior to Visit  Medication Sig Dispense Refill   Ascorbic Acid (VITAMIN C) 1000 MG tablet Take 1,000 mg by mouth daily.     fluticasone (FLONASE) 50 MCG/ACT nasal spray Place into both nostrils daily.     ibuprofen (ADVIL) 600 MG tablet Take 1 tablet (600 mg total) by mouth every 6 (six) hours as needed. 30 tablet 0    levocetirizine (XYZAL) 5 MG tablet Take 5 mg by mouth every evening.     losartan (COZAAR) 25 MG tablet Take 25 mg by mouth daily.     metoprolol succinate (TOPROL-XL) 25 MG 24 hr tablet Take 25 mg by mouth daily.     montelukast (SINGULAIR) 10 MG tablet Take 10 mg by mouth at bedtime.     Multiple Vitamin (MULTIVITAMIN) tablet Take 1 tablet by mouth daily.     omeprazole (PRILOSEC) 20 MG capsule TAKE 1 CAPSULE BY MOUTH EVERY DAY 90 capsule 4   sertraline (ZOLOFT) 50 MG tablet Take 50 mg by mouth daily.     vitamin B-12 (CYANOCOBALAMIN) 1000 MCG tablet Take 1,000 mcg by mouth 3 (three) times daily.     Vitamin D, Ergocalciferol, (DRISDOL) 1.25 MG (50000 UNIT) CAPS capsule TAKE 1 CAPSULE (50,000 UNITS TOTAL) BY MOUTH TWO TIMES A WEEK 24 capsule 3   buPROPion (WELLBUTRIN XL) 150 MG 24 hr tablet Take by mouth.     metFORMIN (GLUCOPHAGE-XR) 500 MG 24 hr tablet Take by mouth.     No current facility-administered medications on file prior to visit.    No Known Allergies  Social History:  reports that she has never smoked. She has never used smokeless tobacco. She reports current alcohol use. She reports that she does not  use drugs.  Family History  Problem Relation Age of Onset   Diabetes Mother    Dementia Mother    Cancer Father        lung    Breast cancer Paternal Aunt     The following portions of the patient's history were reviewed and updated as appropriate: allergies, current medications, past family history, past medical history, past social history, past surgical history and problem list.  Review of Systems Pertinent items noted in HPI and remainder of comprehensive ROS otherwise negative.  Physical Exam:  BP 93/62   Pulse 76   Ht 5\' 4"  (1.626 m)   Wt 193 lb (87.5 kg)   LMP 03/30/2022 (Exact Date)   BMI 33.13 kg/m  CONSTITUTIONAL: Well-developed, well-nourished female in no acute distress.  HENT:  Normocephalic, atraumatic, External right and left ear normal. Oropharynx  is clear and moist EYES: Conjunctivae and EOM are normal. Pupils are equal, round, and reactive to light. No scleral icterus.  NECK: Normal range of motion, supple, no masses.  Normal thyroid.  SKIN: Skin is warm and dry. No rash noted. Not diaphoretic. No erythema. No pallor. MUSCULOSKELETAL: Normal range of motion. No tenderness.  No cyanosis, clubbing, or edema.  2+ distal pulses. NEUROLOGIC: Alert and oriented to person, place, and time. Normal reflexes, muscle tone coordination.  PSYCHIATRIC: Normal mood and affect. Normal behavior. Normal judgment and thought content. CARDIOVASCULAR: Normal heart rate noted, regular rhythm RESPIRATORY: Clear to auscultation bilaterally. Effort and breath sounds normal, no problems with respiration noted. BREASTS: Symmetric in size. No masses, tenderness, skin changes, nipple drainage, or lymphadenopathy bilaterally.  ABDOMEN: Soft, no distention noted.  No tenderness, rebound or guarding.  PELVIC: Normal appearing external genitalia and urethral meatus; normal appearing vaginal mucosa and cervix.  No abnormal discharge noted.  Pap smear obtained.  Normal uterine size, no other palpable masses, no uterine or adnexal tenderness.  .   Assessment and Plan:    1. Women's annual routine gynecological examination  Pap: Will follow up results of pap smear and manage accordingly. Mammogram : ordered  Labs: Hem A 1 C, ( diabetic)  Refills: none  Referral: none  Routine preventative health maintenance measures emphasized. Please refer to After Visit Summary for other counseling recommendations.    She state she has had some hot flashes, she declines medications at this time. States she is on enough medication and would like to avoid.   04/01/2022, CNM [facility],  [facility]

## 2022-04-13 LAB — HEMOGLOBIN A1C
Est. average glucose Bld gHb Est-mCnc: 128 mg/dL
Hgb A1c MFr Bld: 6.1 % — ABNORMAL HIGH (ref 4.8–5.6)

## 2022-04-14 ENCOUNTER — Encounter: Payer: Self-pay | Admitting: Certified Nurse Midwife

## 2022-04-16 DIAGNOSIS — M5412 Radiculopathy, cervical region: Secondary | ICD-10-CM | POA: Diagnosis not present

## 2022-04-16 DIAGNOSIS — M542 Cervicalgia: Secondary | ICD-10-CM | POA: Diagnosis not present

## 2022-04-17 DIAGNOSIS — F321 Major depressive disorder, single episode, moderate: Secondary | ICD-10-CM | POA: Diagnosis not present

## 2022-04-22 LAB — CYTOLOGY - PAP
Comment: NEGATIVE
Diagnosis: NEGATIVE
High risk HPV: NEGATIVE

## 2022-04-24 DIAGNOSIS — L578 Other skin changes due to chronic exposure to nonionizing radiation: Secondary | ICD-10-CM | POA: Diagnosis not present

## 2022-04-24 DIAGNOSIS — L2089 Other atopic dermatitis: Secondary | ICD-10-CM | POA: Diagnosis not present

## 2022-04-24 DIAGNOSIS — D225 Melanocytic nevi of trunk: Secondary | ICD-10-CM | POA: Diagnosis not present

## 2022-04-24 DIAGNOSIS — L821 Other seborrheic keratosis: Secondary | ICD-10-CM | POA: Diagnosis not present

## 2022-05-01 DIAGNOSIS — F321 Major depressive disorder, single episode, moderate: Secondary | ICD-10-CM | POA: Diagnosis not present

## 2022-05-15 DIAGNOSIS — H5213 Myopia, bilateral: Secondary | ICD-10-CM | POA: Diagnosis not present

## 2022-05-15 DIAGNOSIS — F321 Major depressive disorder, single episode, moderate: Secondary | ICD-10-CM | POA: Diagnosis not present

## 2022-05-29 DIAGNOSIS — F321 Major depressive disorder, single episode, moderate: Secondary | ICD-10-CM | POA: Diagnosis not present

## 2022-07-10 DIAGNOSIS — F321 Major depressive disorder, single episode, moderate: Secondary | ICD-10-CM | POA: Diagnosis not present

## 2022-07-24 DIAGNOSIS — F321 Major depressive disorder, single episode, moderate: Secondary | ICD-10-CM | POA: Diagnosis not present

## 2022-08-07 DIAGNOSIS — F321 Major depressive disorder, single episode, moderate: Secondary | ICD-10-CM | POA: Diagnosis not present

## 2022-08-09 DIAGNOSIS — Z09 Encounter for follow-up examination after completed treatment for conditions other than malignant neoplasm: Secondary | ICD-10-CM | POA: Diagnosis not present

## 2022-08-09 DIAGNOSIS — E781 Pure hyperglyceridemia: Secondary | ICD-10-CM | POA: Diagnosis not present

## 2022-08-09 DIAGNOSIS — E1159 Type 2 diabetes mellitus with other circulatory complications: Secondary | ICD-10-CM | POA: Diagnosis not present

## 2022-08-09 DIAGNOSIS — I152 Hypertension secondary to endocrine disorders: Secondary | ICD-10-CM | POA: Diagnosis not present

## 2022-08-09 DIAGNOSIS — E1169 Type 2 diabetes mellitus with other specified complication: Secondary | ICD-10-CM | POA: Diagnosis not present

## 2022-08-21 DIAGNOSIS — F321 Major depressive disorder, single episode, moderate: Secondary | ICD-10-CM | POA: Diagnosis not present

## 2022-09-04 DIAGNOSIS — F321 Major depressive disorder, single episode, moderate: Secondary | ICD-10-CM | POA: Diagnosis not present

## 2022-09-17 DIAGNOSIS — F321 Major depressive disorder, single episode, moderate: Secondary | ICD-10-CM | POA: Diagnosis not present

## 2022-10-02 DIAGNOSIS — F321 Major depressive disorder, single episode, moderate: Secondary | ICD-10-CM | POA: Diagnosis not present

## 2022-10-22 DIAGNOSIS — F321 Major depressive disorder, single episode, moderate: Secondary | ICD-10-CM | POA: Diagnosis not present

## 2022-10-30 DIAGNOSIS — F321 Major depressive disorder, single episode, moderate: Secondary | ICD-10-CM | POA: Diagnosis not present

## 2022-11-13 DIAGNOSIS — F321 Major depressive disorder, single episode, moderate: Secondary | ICD-10-CM | POA: Diagnosis not present

## 2022-11-27 DIAGNOSIS — F321 Major depressive disorder, single episode, moderate: Secondary | ICD-10-CM | POA: Diagnosis not present

## 2022-12-03 DIAGNOSIS — F321 Major depressive disorder, single episode, moderate: Secondary | ICD-10-CM | POA: Diagnosis not present

## 2022-12-09 ENCOUNTER — Telehealth: Payer: BC Managed Care – PPO | Admitting: Physician Assistant

## 2022-12-09 DIAGNOSIS — R3989 Other symptoms and signs involving the genitourinary system: Secondary | ICD-10-CM | POA: Diagnosis not present

## 2022-12-09 MED ORDER — CEPHALEXIN 500 MG PO CAPS: 500.0000 mg | ORAL_CAPSULE | Freq: Two times a day (BID) | ORAL | 0 refills | Status: AC

## 2022-12-09 NOTE — Progress Notes (Signed)

## 2022-12-09 NOTE — Progress Notes (Signed)
I have spent 5 minutes in review of e-visit questionnaire, review and updating patient chart, medical decision making and response to patient.   Kobie Matkins Cody Jkayla Spiewak, PA-C    

## 2022-12-10 DIAGNOSIS — F321 Major depressive disorder, single episode, moderate: Secondary | ICD-10-CM | POA: Diagnosis not present

## 2022-12-20 ENCOUNTER — Other Ambulatory Visit: Payer: Self-pay | Admitting: Certified Nurse Midwife

## 2022-12-25 DIAGNOSIS — F321 Major depressive disorder, single episode, moderate: Secondary | ICD-10-CM | POA: Diagnosis not present

## 2023-01-08 DIAGNOSIS — F321 Major depressive disorder, single episode, moderate: Secondary | ICD-10-CM | POA: Diagnosis not present

## 2023-01-22 DIAGNOSIS — F321 Major depressive disorder, single episode, moderate: Secondary | ICD-10-CM | POA: Diagnosis not present

## 2023-02-14 DIAGNOSIS — E538 Deficiency of other specified B group vitamins: Secondary | ICD-10-CM | POA: Diagnosis not present

## 2023-02-14 DIAGNOSIS — E559 Vitamin D deficiency, unspecified: Secondary | ICD-10-CM | POA: Diagnosis not present

## 2023-02-14 DIAGNOSIS — F331 Major depressive disorder, recurrent, moderate: Secondary | ICD-10-CM | POA: Diagnosis not present

## 2023-02-14 DIAGNOSIS — R399 Unspecified symptoms and signs involving the genitourinary system: Secondary | ICD-10-CM | POA: Diagnosis not present

## 2023-02-14 DIAGNOSIS — R000 Tachycardia, unspecified: Secondary | ICD-10-CM | POA: Diagnosis not present

## 2023-02-14 DIAGNOSIS — I152 Hypertension secondary to endocrine disorders: Secondary | ICD-10-CM | POA: Diagnosis not present

## 2023-02-14 DIAGNOSIS — E781 Pure hyperglyceridemia: Secondary | ICD-10-CM | POA: Diagnosis not present

## 2023-02-14 DIAGNOSIS — E1169 Type 2 diabetes mellitus with other specified complication: Secondary | ICD-10-CM | POA: Diagnosis not present

## 2023-02-14 DIAGNOSIS — E1159 Type 2 diabetes mellitus with other circulatory complications: Secondary | ICD-10-CM | POA: Diagnosis not present

## 2023-02-19 DIAGNOSIS — F321 Major depressive disorder, single episode, moderate: Secondary | ICD-10-CM | POA: Diagnosis not present

## 2023-08-22 ENCOUNTER — Other Ambulatory Visit: Payer: Self-pay | Admitting: Gerontology

## 2023-08-22 DIAGNOSIS — Z1231 Encounter for screening mammogram for malignant neoplasm of breast: Secondary | ICD-10-CM

## 2024-03-25 IMAGING — MG MM DIGITAL SCREENING BILAT W/ TOMO AND CAD
8 series · 8 of 24 positions shown · non-contrast
Comparison: Previous exam(s).

CLINICAL DATA: Screening.

EXAM:
DIGITAL SCREENING BILATERAL MAMMOGRAM WITH TOMOSYNTHESIS AND CAD
TECHNIQUE: Bilateral screening digital craniocaudal and mediolateral oblique
mammograms were obtained. Bilateral screening digital breast
tomosynthesis was performed. The images were evaluated with
computer-aided detection.

[R MLO synth-2D]
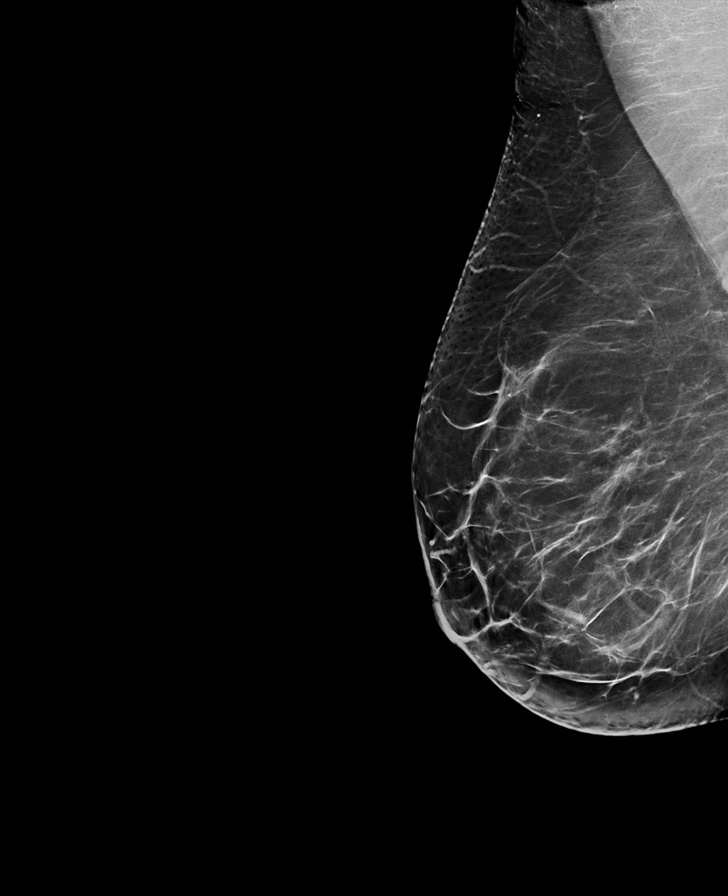

[R CC synth-2D]
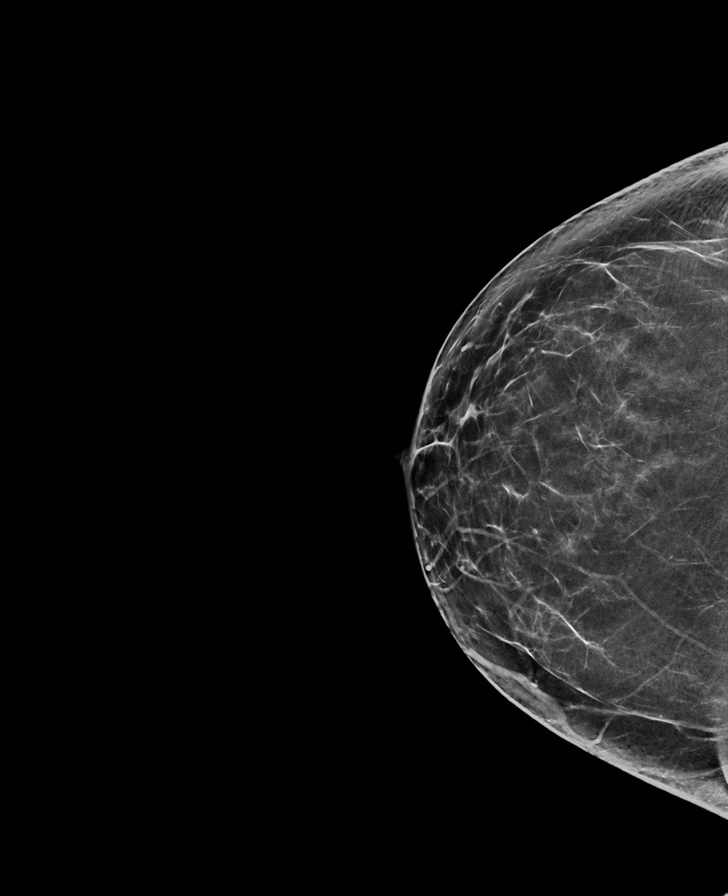

[L CC synth-2D]
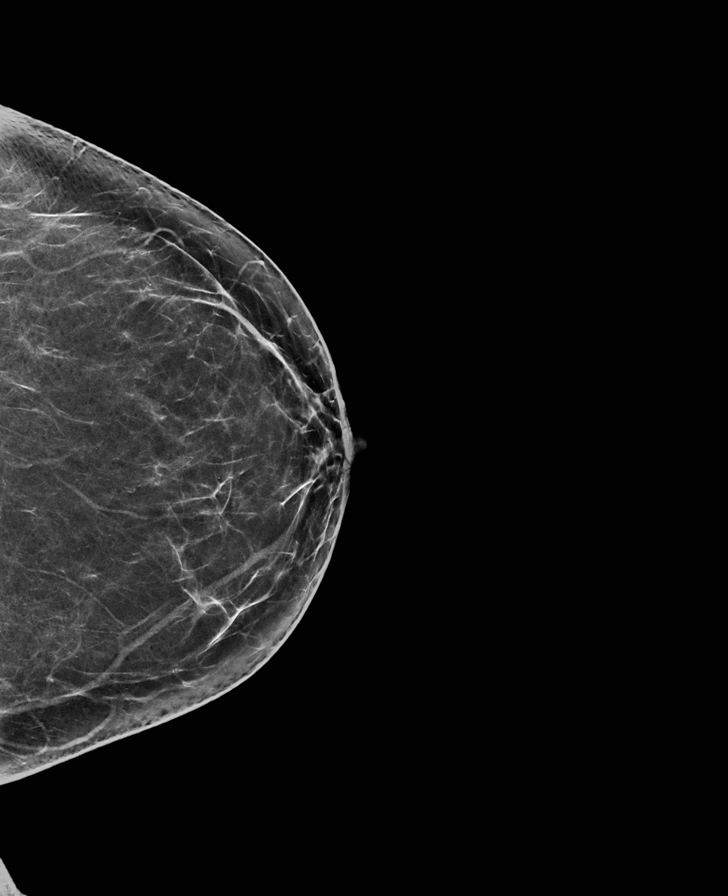

[L MLO synth-2D]
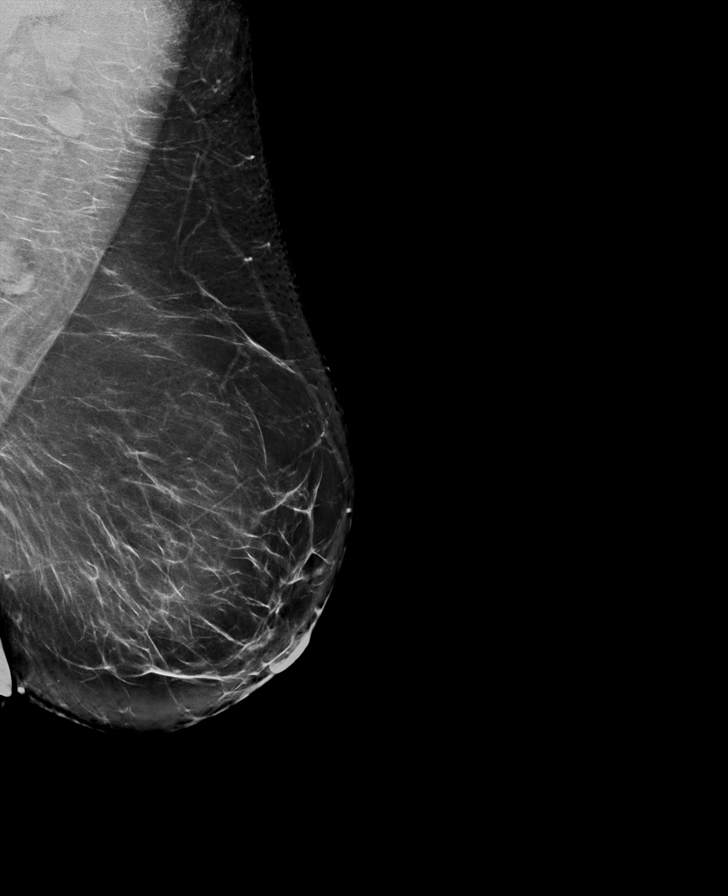

[L CC tomo · tomo slice 37/74.0]
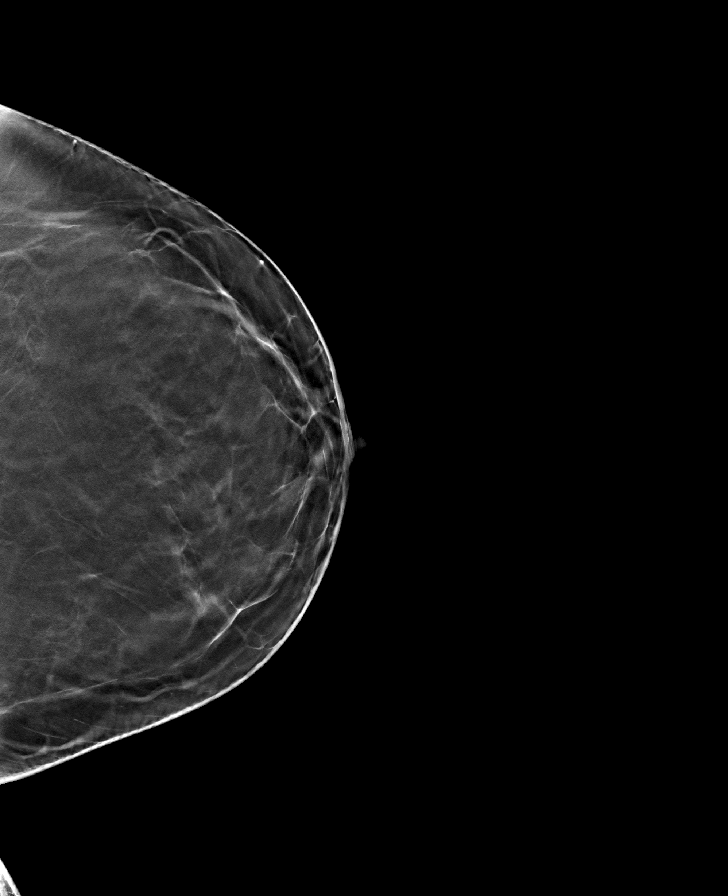

[R CC tomo · tomo slice 39/77.0]
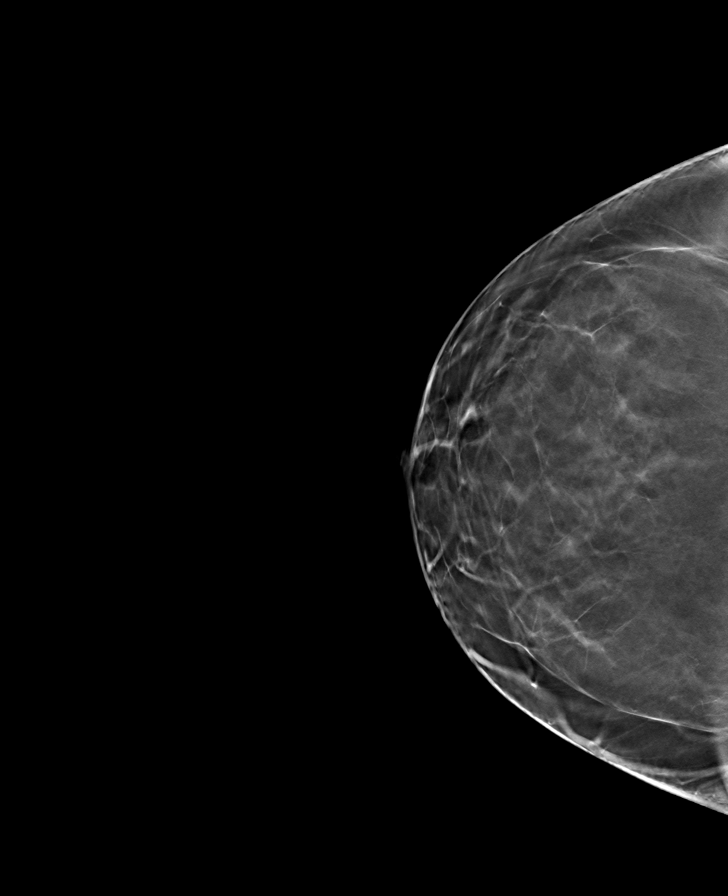

[L MLO tomo · tomo slice 49/97.0]
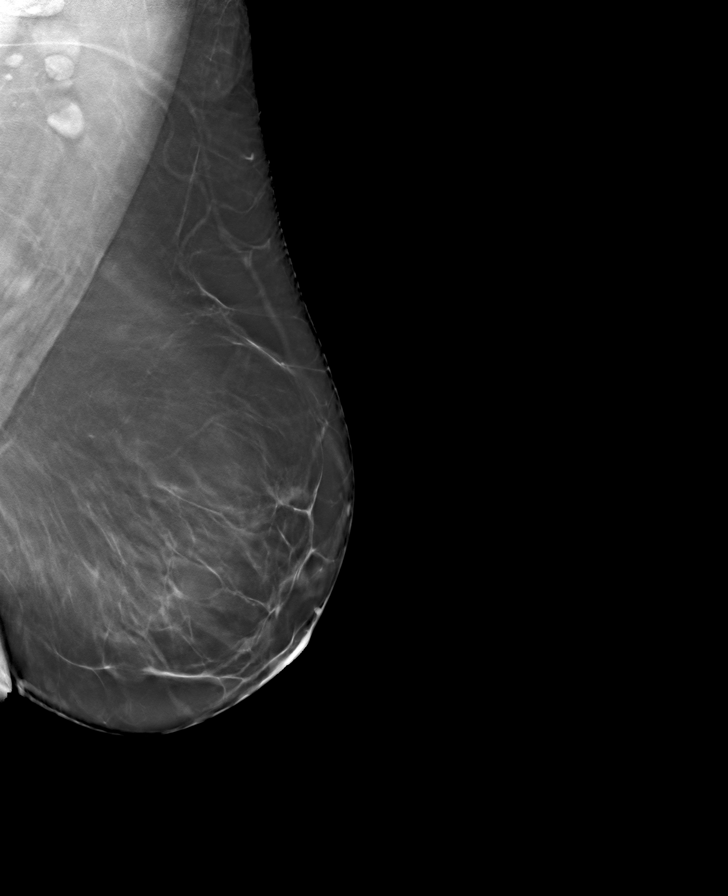

[R MLO tomo · tomo slice 45/90.0]
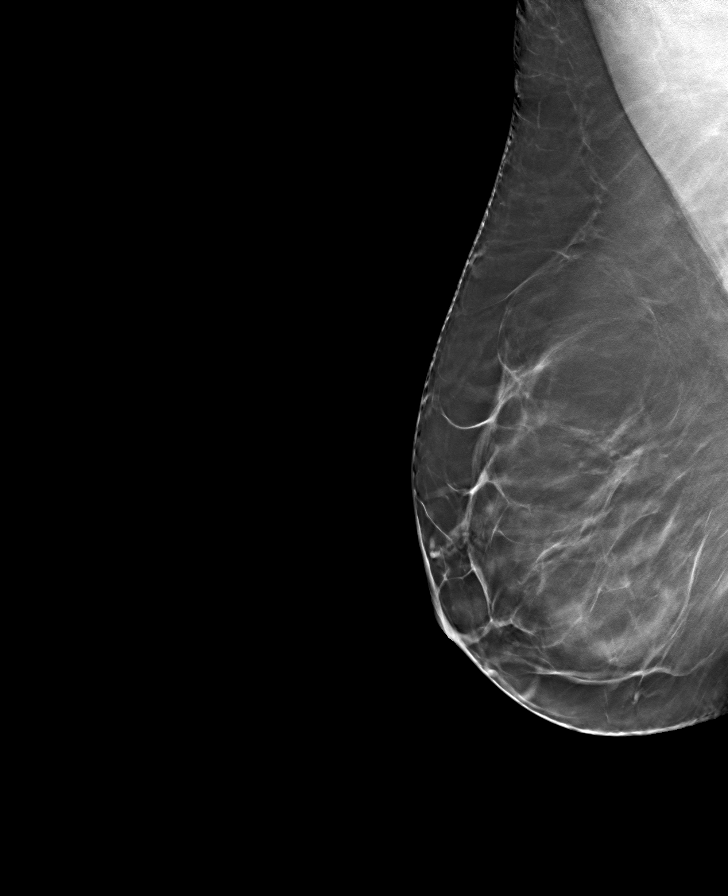

[8 of 24 positions shown; findings below may reference images not displayed]

ACR Breast Density Category b: There are scattered areas of
fibroglandular density.
FINDINGS: There are no findings suspicious for malignancy.
IMPRESSION: No mammographic evidence of malignancy. A result letter of this
screening mammogram will be mailed directly to the patient.

RECOMMENDATION:
Screening mammogram in one year. (Code:51-O-LD2)

BI-RADS CATEGORY  1: Negative.
